# Patient Record
Sex: Male | Born: 1955 | Race: White | Hispanic: No | Marital: Married | Smoking: Current every day smoker
Health system: Southern US, Community
[De-identification: ages and names within clinical notes are randomized; demographics above are authoritative.]

## PROBLEM LIST (undated history)

## (undated) DIAGNOSIS — I10 Essential (primary) hypertension: Secondary | ICD-10-CM

## (undated) DIAGNOSIS — E119 Type 2 diabetes mellitus without complications: Secondary | ICD-10-CM

## (undated) DIAGNOSIS — E785 Hyperlipidemia, unspecified: Secondary | ICD-10-CM

## (undated) HISTORY — DX: Type 2 diabetes mellitus without complications: E11.9

## (undated) HISTORY — PX: OTHER SURGICAL HISTORY: SHX169

## (undated) HISTORY — DX: Hyperlipidemia, unspecified: E78.5

## (undated) HISTORY — DX: Essential (primary) hypertension: I10

---

## 2008-12-22 ENCOUNTER — Ambulatory Visit: Payer: Self-pay | Admitting: Interventional Radiology

## 2008-12-22 ENCOUNTER — Encounter: Payer: Self-pay | Admitting: Emergency Medicine

## 2008-12-23 ENCOUNTER — Encounter: Payer: Self-pay | Admitting: Gastroenterology

## 2008-12-23 ENCOUNTER — Inpatient Hospital Stay (HOSPITAL_COMMUNITY): Admission: EM | Admit: 2008-12-23 | Discharge: 2008-12-26 | Payer: Self-pay | Admitting: Internal Medicine

## 2008-12-23 ENCOUNTER — Ambulatory Visit: Payer: Self-pay | Admitting: Internal Medicine

## 2008-12-23 DIAGNOSIS — R933 Abnormal findings on diagnostic imaging of other parts of digestive tract: Secondary | ICD-10-CM

## 2008-12-28 ENCOUNTER — Encounter: Payer: Self-pay | Admitting: Gastroenterology

## 2008-12-28 ENCOUNTER — Ambulatory Visit (HOSPITAL_COMMUNITY): Admission: RE | Admit: 2008-12-28 | Discharge: 2008-12-28 | Payer: Self-pay | Admitting: Gastroenterology

## 2009-01-03 ENCOUNTER — Encounter (INDEPENDENT_AMBULATORY_CARE_PROVIDER_SITE_OTHER): Payer: Self-pay | Admitting: *Deleted

## 2009-01-03 ENCOUNTER — Telehealth (INDEPENDENT_AMBULATORY_CARE_PROVIDER_SITE_OTHER): Payer: Self-pay | Admitting: *Deleted

## 2009-01-06 ENCOUNTER — Telehealth: Payer: Self-pay | Admitting: Gastroenterology

## 2009-01-06 ENCOUNTER — Emergency Department (HOSPITAL_BASED_OUTPATIENT_CLINIC_OR_DEPARTMENT_OTHER): Admission: EM | Admit: 2009-01-06 | Discharge: 2009-01-06 | Payer: Self-pay | Admitting: Emergency Medicine

## 2009-01-10 ENCOUNTER — Ambulatory Visit: Payer: Self-pay | Admitting: Internal Medicine

## 2009-01-10 ENCOUNTER — Ambulatory Visit (HOSPITAL_COMMUNITY): Admission: RE | Admit: 2009-01-10 | Discharge: 2009-01-10 | Payer: Self-pay | Admitting: Gastroenterology

## 2009-01-10 DIAGNOSIS — R634 Abnormal weight loss: Secondary | ICD-10-CM

## 2009-01-10 DIAGNOSIS — K859 Acute pancreatitis without necrosis or infection, unspecified: Secondary | ICD-10-CM | POA: Insufficient documentation

## 2009-01-10 DIAGNOSIS — R112 Nausea with vomiting, unspecified: Secondary | ICD-10-CM | POA: Insufficient documentation

## 2009-01-10 DIAGNOSIS — R1013 Epigastric pain: Secondary | ICD-10-CM

## 2009-01-10 DIAGNOSIS — I10 Essential (primary) hypertension: Secondary | ICD-10-CM | POA: Insufficient documentation

## 2009-01-11 ENCOUNTER — Inpatient Hospital Stay (HOSPITAL_COMMUNITY): Admission: AD | Admit: 2009-01-11 | Discharge: 2009-01-16 | Payer: Self-pay | Admitting: Gastroenterology

## 2009-01-11 ENCOUNTER — Telehealth (INDEPENDENT_AMBULATORY_CARE_PROVIDER_SITE_OTHER): Payer: Self-pay | Admitting: *Deleted

## 2009-01-12 ENCOUNTER — Telehealth: Payer: Self-pay | Admitting: Gastroenterology

## 2009-01-17 ENCOUNTER — Encounter (INDEPENDENT_AMBULATORY_CARE_PROVIDER_SITE_OTHER): Payer: Self-pay | Admitting: *Deleted

## 2009-01-17 ENCOUNTER — Telehealth (INDEPENDENT_AMBULATORY_CARE_PROVIDER_SITE_OTHER): Payer: Self-pay | Admitting: *Deleted

## 2009-02-13 ENCOUNTER — Ambulatory Visit: Payer: Self-pay | Admitting: Gastroenterology

## 2009-03-06 ENCOUNTER — Telehealth (INDEPENDENT_AMBULATORY_CARE_PROVIDER_SITE_OTHER): Payer: Self-pay | Admitting: *Deleted

## 2009-03-06 ENCOUNTER — Encounter: Payer: Self-pay | Admitting: Gastroenterology

## 2009-04-17 ENCOUNTER — Telehealth (INDEPENDENT_AMBULATORY_CARE_PROVIDER_SITE_OTHER): Payer: Self-pay | Admitting: *Deleted

## 2009-04-19 ENCOUNTER — Encounter (INDEPENDENT_AMBULATORY_CARE_PROVIDER_SITE_OTHER): Payer: Self-pay | Admitting: *Deleted

## 2010-08-27 ENCOUNTER — Encounter: Payer: Self-pay | Admitting: Gastroenterology

## 2010-10-23 IMAGING — CT CT PELVIS W/ CM
2 of 10 series · 12 of 46 positions shown, 18 images · IV contrast (agent unspecified)
Comparison: [HOSPITAL] [HOSPITAL] chest x-ray 12/22/2008
and abdominal pelvic CT 12/22/2008.

CT ABDOMEN

CLINICAL DATA: Follow-up cystic pancreatic lesion 12/22/2008 CT.
Upper abdominal pain, nausea for 2-3 weeks.

CT ABDOMEN WITHOUT AND WITH CONTRAST
CT PELVIS WITH CONTRAST
TECHNIQUE: Multidetector CT imaging of the abdomen was performed
initially following the standard protocol before administration of
intravenous contrast.  Multidetector CT imaging of the abdomen and
pelvis was then performed following the standard protocol during
the bolus injection of intravenous contrast.
Contrast: Pre and post intravenous 100 ml [id number].

[Series 7: venous thins pacs · axial · portal-venous · 0.74mm/px · z∈[-432,-72]mm · 9 of 151 slices shown, 15 images]
[im 16/151  soft-tissue]
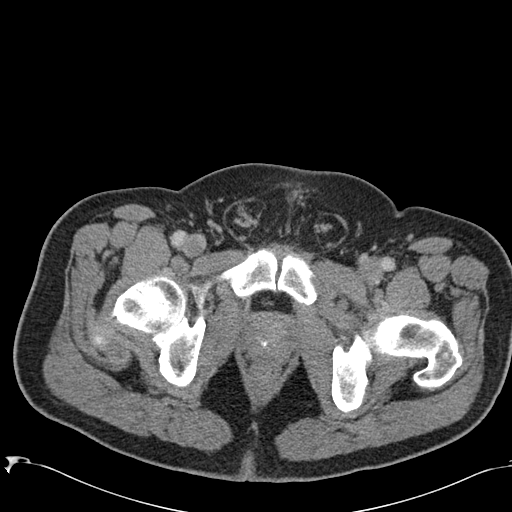
[im 16/151  bone]
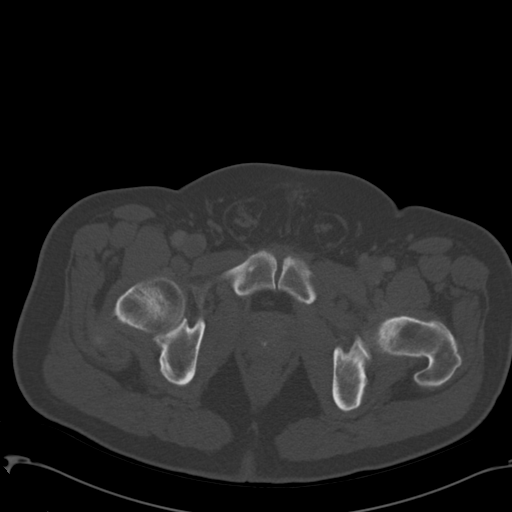
[im 31/151  soft-tissue]
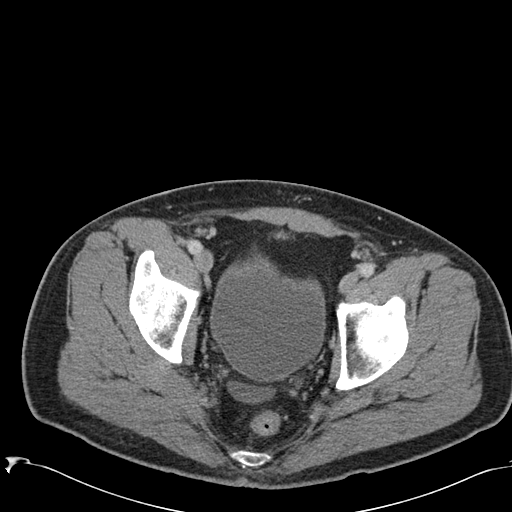
[im 46/151  soft-tissue]
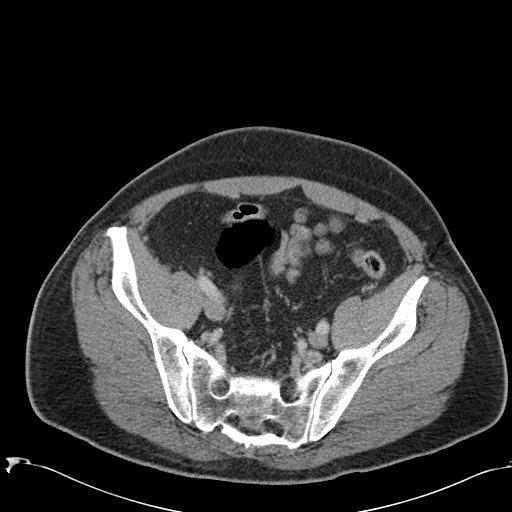
[im 61/151  soft-tissue]
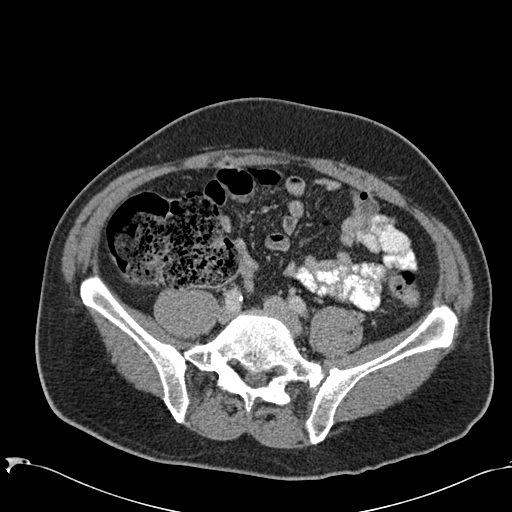
[im 76/151  soft-tissue]
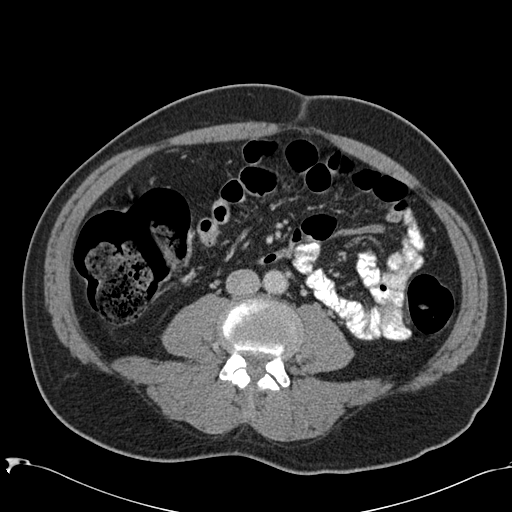
[im 91/151  soft-tissue]
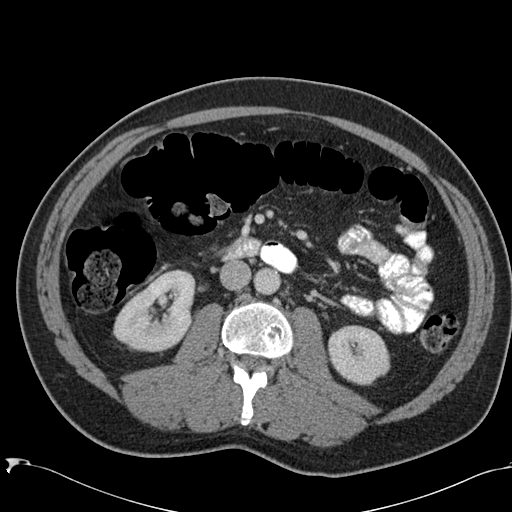
[im 91/151  lung]
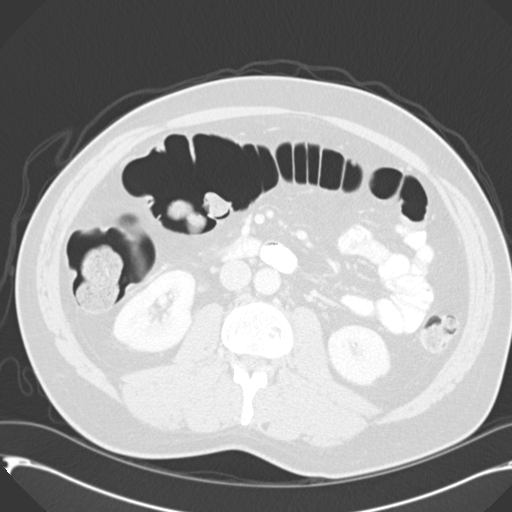
[im 106/151  soft-tissue]
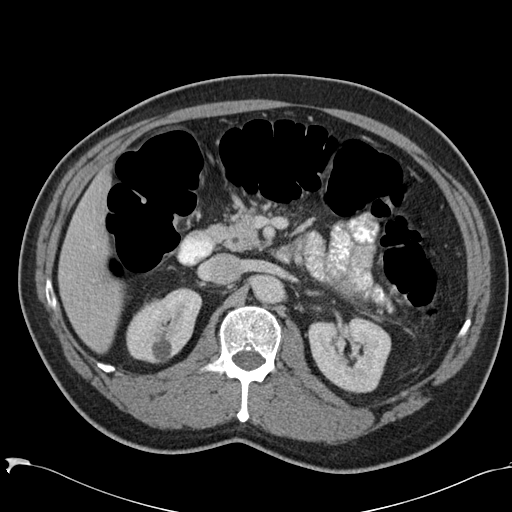
[im 106/151  lung]
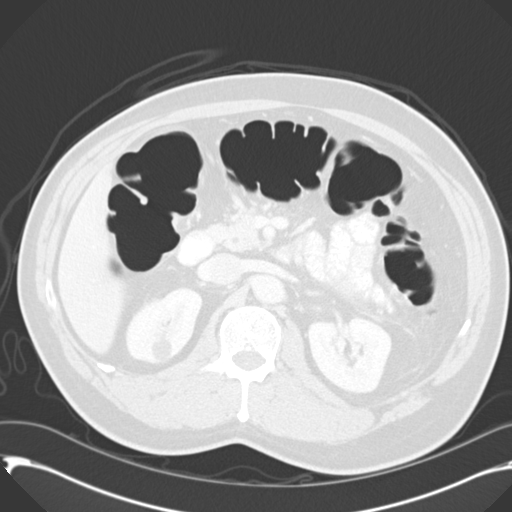
[im 121/151  soft-tissue]
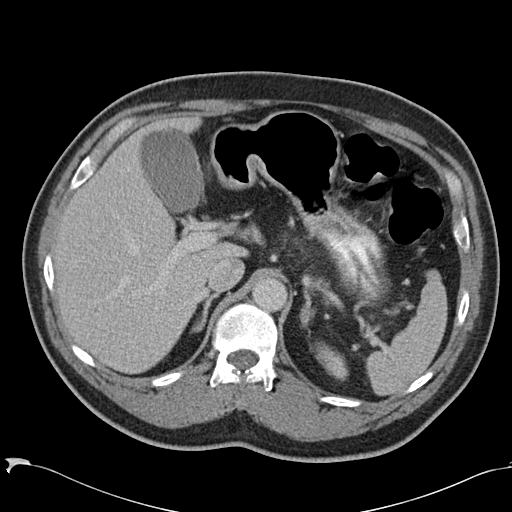
[im 121/151  lung]
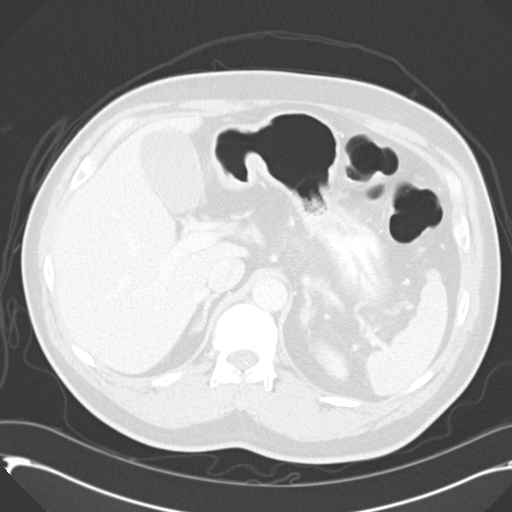
[im 136/151  soft-tissue]
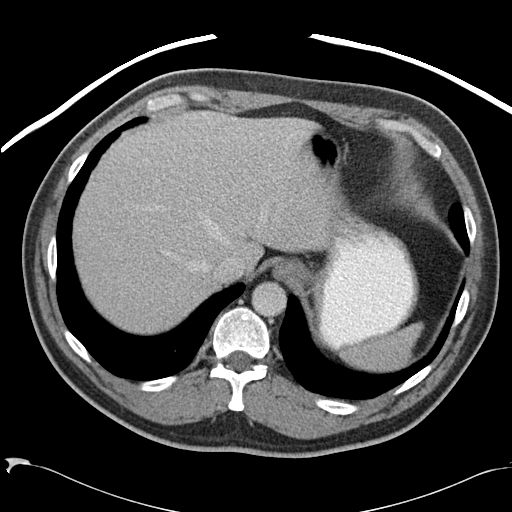
[im 136/151  lung]
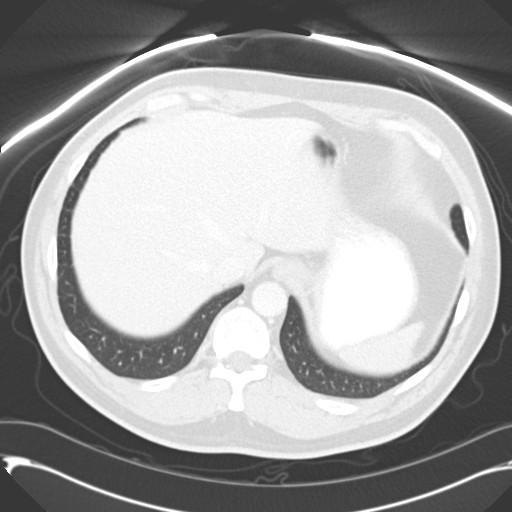
[im 136/151  bone]
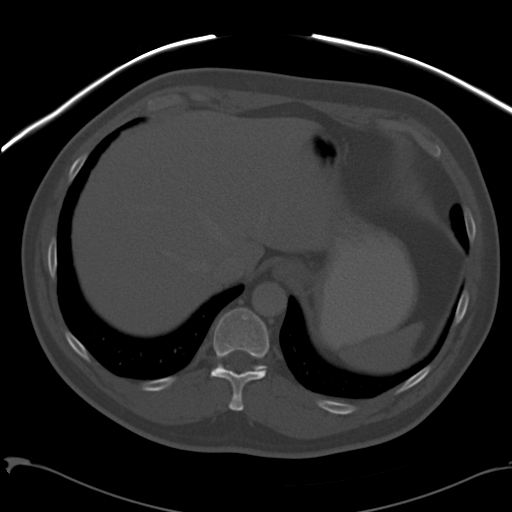

[Series 604: <mpr thick range(2)> · coronal · 0.88mm/px · 3 of 100 slices shown]
[im 20/100  soft-tissue]
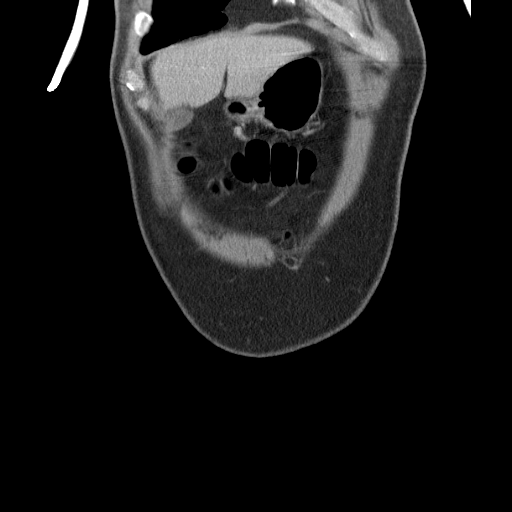
[im 40/100  soft-tissue]
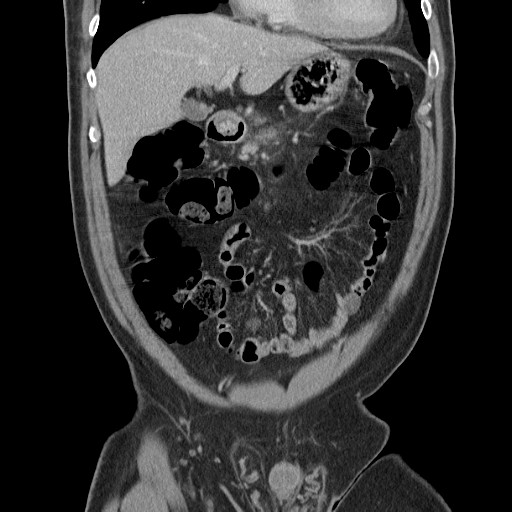
[im 60/100  soft-tissue]
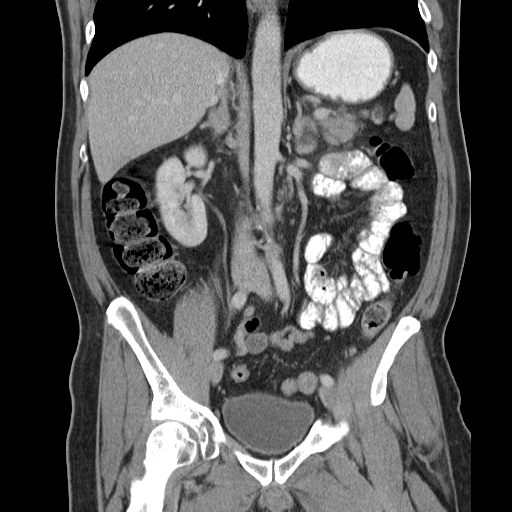

[12 of 46 positions shown; findings below may reference images not displayed]

FINDINGS: Since prior study decreased in peripancreatic
infiltration is seen at the head neck region.  In addition, since
previous 21 mm cystic focus at pancreatic head neck region-
currently measures 7 mm (axial image [DATE]).  Interval development
of 9 mm and adjacent 19 mm AP X 12 mm wide pancreatic body low
density cystic foci (axial image [REDACTED]) and pancreatic tail
15 mm AP X 10 mm wide low density cystic focus(axial image [DATE])
noted with increase pancreatic tail peripancreatic fat stranding.
Currently superior mesenteric and splenic veins and adjacent
arteries appear patent without evidence for avascular invasion.
Lung bases are clear.  No interval ascites visualized.  Possible
dependent slight hyperdense gallsludge or tiny gallstones seen at
gallbladder neck (axial image [DATE]).  No other dilated biliary or
pancreatic ducts visualized.  Currently better seen is 12 mm likely
benign low density right adrenal adenoma, 19 mm posterior upper
pole right renal cyst, and slight atheromatous vascular
calcification with normal caliber abdominal aorta.  No abnormal
upper abdominal calcifications visualized.  Remaining abdominal
organs appear normal without additional inflammation or adenopathy.
IMPRESSION: 1.  Changes at pancreas favoring interval evolution of prior
pancreatitis with current multiple small pancreatic pseudocysts
from head neck region to tail.  Improvement of peri pancreatic
edema at head and neck with slightly greater pancreatic tail
peripancreatic edema visualized.  No dilated biliary or pancreatic
ducts related to findings.
2.  Possible focal hyperdense gallsludge or tiny gallstones at the
gallbladder neck.
3.  Incidental right adrenal adenoma and right renal cyst.
4.  No additional acute findings.

CT PELVIS
FINDINGS: Interval slight dependent pelvic free fluid measures 2
cm long (sagittal image 50) X 1.2 cm AP X 3.6 cm wide (axial image
73/5).  Remaining pelvic organs (to include appendix) appear stable
and unremarkable with no interval inflammation or adenopathy seen.
IMPRESSION: 1.  Slight dependent pelvic free fluid since prior study.
2.  Otherwise stable - negative.

## 2010-11-12 LAB — COMPREHENSIVE METABOLIC PANEL
ALT: 15 U/L (ref 0–53)
AST: 17 U/L (ref 0–37)
Albumin: 3.1 g/dL — ABNORMAL LOW (ref 3.5–5.2)
Albumin: 3.4 g/dL — ABNORMAL LOW (ref 3.5–5.2)
Alkaline Phosphatase: 55 U/L (ref 39–117)
BUN: 10 mg/dL (ref 6–23)
BUN: 18 mg/dL (ref 6–23)
BUN: 23 mg/dL (ref 6–23)
CO2: 19 mEq/L (ref 19–32)
CO2: 24 mEq/L (ref 19–32)
Calcium: 9.4 mg/dL (ref 8.4–10.5)
Calcium: 9.7 mg/dL (ref 8.4–10.5)
Chloride: 100 mEq/L (ref 96–112)
Creatinine, Ser: 1.1 mg/dL (ref 0.4–1.5)
Creatinine, Ser: 1.2 mg/dL (ref 0.4–1.5)
Creatinine, Ser: 1.29 mg/dL (ref 0.4–1.5)
GFR calc Af Amer: >60 mL/min (ref >60)
GFR calc Af Amer: >60 mL/min (ref >60)
GFR calc non Af Amer: 58 mL/min — ABNORMAL LOW (ref >60)
GFR calc non Af Amer: >60 mL/min (ref >60)
Glucose, Bld: 113 mg/dL — ABNORMAL HIGH (ref 70–99)
Glucose, Bld: 116 mg/dL — ABNORMAL HIGH (ref 70–99)
Potassium: 3.9 mEq/L (ref 3.5–5.1)
Sodium: 135 mEq/L (ref 135–145)
Total Bilirubin: 0.4 mg/dL (ref 0.3–1.2)
Total Bilirubin: 0.8 mg/dL (ref 0.3–1.2)
Total Protein: 6.6 g/dL (ref 6.0–8.3)
Total Protein: 7.4 g/dL (ref 6.0–8.3)

## 2010-11-12 LAB — BASIC METABOLIC PANEL
BUN: 21 mg/dL (ref 6–23)
CO2: 24 mEq/L (ref 19–32)
CO2: 25 mEq/L (ref 19–32)
Calcium: 9 mg/dL (ref 8.4–10.5)
Calcium: 9 mg/dL (ref 8.4–10.5)
Chloride: 103 mEq/L (ref 96–112)
Chloride: 99 mEq/L (ref 96–112)
Creatinine, Ser: 1.34 mg/dL (ref 0.4–1.5)
GFR calc Af Amer: >60 mL/min (ref >60)
GFR calc non Af Amer: 56 mL/min — ABNORMAL LOW (ref >60)
Glucose, Bld: 95 mg/dL (ref 70–99)
Glucose, Bld: 96 mg/dL (ref 70–99)
Potassium: 4 mEq/L (ref 3.5–5.1)
Potassium: 4.6 mEq/L (ref 3.5–5.1)
Sodium: 130 mEq/L — ABNORMAL LOW (ref 135–145)
Sodium: 133 mEq/L — ABNORMAL LOW (ref 135–145)

## 2010-11-12 LAB — CBC
HCT: 40.8 % (ref 39.0–52.0)
HCT: 43.3 % (ref 39.0–52.0)
HCT: 49 % (ref 39.0–52.0)
Hemoglobin: 13.7 g/dL (ref 13.0–17.0)
Hemoglobin: 15.8 g/dL (ref 13.0–17.0)
Hemoglobin: 16.4 g/dL (ref 13.0–17.0)
MCHC: 33.5 g/dL (ref 30.0–36.0)
MCHC: 33.5 g/dL (ref 30.0–36.0)
MCHC: 34.4 g/dL (ref 30.0–36.0)
MCV: 89.2 fL (ref 78.0–100.0)
MCV: 89.6 fL (ref 78.0–100.0)
MCV: 90.2 fL (ref 78.0–100.0)
Platelets: 176 10*3/uL (ref 150–400)
Platelets: 221 K/uL (ref 150–400)
RBC: 5.32 MIL/uL (ref 4.22–5.81)
RBC: 5.47 MIL/uL (ref 4.22–5.81)
RDW: 13.2 % (ref 11.5–15.5)
RDW: 13.6 % (ref 11.5–15.5)
RDW: 14.3 % (ref 11.5–15.5)
WBC: 11.1 10*3/uL — ABNORMAL HIGH (ref 4.0–10.5)
WBC: 11.6 K/uL — ABNORMAL HIGH (ref 4.0–10.5)

## 2010-11-12 LAB — DIFFERENTIAL
Basophils Absolute: 0 10*3/uL (ref 0.0–0.1)
Basophils Relative: 0 % (ref 0–1)
Eosinophils Absolute: 0.2 K/uL (ref 0.0–0.7)
Eosinophils Relative: 2 % (ref 0–5)
Lymphocytes Relative: 11 % — ABNORMAL LOW (ref 12–46)
Lymphs Abs: 1.3 10*3/uL (ref 0.7–4.0)
Monocytes Absolute: 0.3 K/uL (ref 0.1–1.0)
Monocytes Relative: 3 % (ref 3–12)
Neutro Abs: 9.8 10*3/uL — ABNORMAL HIGH (ref 1.7–7.7)
Neutrophils Relative %: 84 % — ABNORMAL HIGH (ref 43–77)

## 2010-11-12 LAB — COMPREHENSIVE METABOLIC PANEL WITH GFR
ALT: 11 U/L (ref 0–53)
AST: 17 U/L (ref 0–37)
Albumin: 3.9 g/dL (ref 3.5–5.2)
Alkaline Phosphatase: 70 U/L (ref 39–117)
Chloride: 104 meq/L (ref 96–112)
Potassium: 4.3 meq/L (ref 3.5–5.1)
Sodium: 137 meq/L (ref 135–145)
Total Protein: 7.1 g/dL (ref 6.0–8.3)

## 2010-11-12 LAB — LIPASE, BLOOD
Lipase: 101 U/L — ABNORMAL HIGH (ref 11–59)
Lipase: 1393 U/L — ABNORMAL HIGH (ref 23–300)

## 2010-11-12 LAB — GLUCOSE, CAPILLARY: Glucose-Capillary: 96 mg/dL (ref 70–99)

## 2010-11-12 LAB — AMYLASE: Amylase: 517 U/L — ABNORMAL HIGH (ref 27–131)

## 2010-11-13 LAB — DIFFERENTIAL
Basophils Relative: 1 % (ref 0–1)
Eosinophils Relative: 1 % (ref 0–5)
Lymphocytes Relative: 12 % (ref 12–46)
Monocytes Absolute: 0.8 10*3/uL (ref 0.1–1.0)
Monocytes Relative: 6 % (ref 3–12)
Neutrophils Relative %: 80 % — ABNORMAL HIGH (ref 43–77)

## 2010-11-13 LAB — CBC
HCT: 43.6 % (ref 39.0–52.0)
HCT: 45.6 % (ref 39.0–52.0)
HCT: 50 % (ref 39.0–52.0)
Hemoglobin: 14.4 g/dL (ref 13.0–17.0)
Hemoglobin: 14.8 g/dL (ref 13.0–17.0)
MCHC: 32.8 g/dL (ref 30.0–36.0)
MCHC: 33.8 g/dL (ref 30.0–36.0)
MCV: 89.2 fL (ref 78.0–100.0)
MCV: 89.9 fL (ref 78.0–100.0)
MCV: 90.2 fL (ref 78.0–100.0)
Platelets: 135 10*3/uL — ABNORMAL LOW (ref 150–400)
Platelets: 137 10*3/uL — ABNORMAL LOW (ref 150–400)
Platelets: 147 10*3/uL — ABNORMAL LOW (ref 150–400)
RBC: 4.89 MIL/uL (ref 4.22–5.81)
RDW: 14.1 % (ref 11.5–15.5)
RDW: 14.7 % (ref 11.5–15.5)
RDW: 14.9 % (ref 11.5–15.5)
WBC: 10.3 10*3/uL (ref 4.0–10.5)
WBC: 8.7 10*3/uL (ref 4.0–10.5)

## 2010-11-13 LAB — BASIC METABOLIC PANEL
BUN: 15 mg/dL (ref 6–23)
BUN: 19 mg/dL (ref 6–23)
BUN: 23 mg/dL (ref 6–23)
CO2: 25 mEq/L (ref 19–32)
Calcium: 9 mg/dL (ref 8.4–10.5)
Chloride: 99 mEq/L (ref 96–112)
Chloride: 99 mEq/L (ref 96–112)
Creatinine, Ser: 1.45 mg/dL (ref 0.4–1.5)
GFR calc Af Amer: 56 mL/min — ABNORMAL LOW (ref >60)
GFR calc non Af Amer: 47 mL/min — ABNORMAL LOW (ref >60)
GFR calc non Af Amer: 51 mL/min — ABNORMAL LOW (ref >60)
GFR calc non Af Amer: >60 mL/min (ref >60)
Glucose, Bld: 102 mg/dL — ABNORMAL HIGH (ref 70–99)
Glucose, Bld: 108 mg/dL — ABNORMAL HIGH (ref 70–99)
Glucose, Bld: 119 mg/dL — ABNORMAL HIGH (ref 70–99)
Potassium: 3.7 mEq/L (ref 3.5–5.1)
Sodium: 135 mEq/L (ref 135–145)

## 2010-11-13 LAB — URINALYSIS, ROUTINE W REFLEX MICROSCOPIC
Bilirubin Urine: NEGATIVE
Ketones, ur: 15 mg/dL — AB
Nitrite: NEGATIVE
Protein, ur: 100 mg/dL — AB
Urobilinogen, UA: 0.2 mg/dL (ref 0.0–1.0)

## 2010-11-13 LAB — URINE MICROSCOPIC-ADD ON

## 2010-11-13 LAB — LIPID PANEL
Cholesterol: 168 mg/dL (ref 0–200)
HDL: 27 mg/dL — ABNORMAL LOW (ref >39)
LDL Cholesterol: 125 mg/dL — ABNORMAL HIGH (ref 0–99)
Total CHOL/HDL Ratio: 6.2 RATIO

## 2010-11-13 LAB — TSH: TSH: 2.309 u[IU]/mL (ref 0.350–4.500)

## 2010-11-13 LAB — COMPREHENSIVE METABOLIC PANEL
Albumin: 4.2 g/dL (ref 3.5–5.2)
Alkaline Phosphatase: 75 U/L (ref 39–117)
BUN: 20 mg/dL (ref 6–23)
Calcium: 9.2 mg/dL (ref 8.4–10.5)
Potassium: 3.8 mEq/L (ref 3.5–5.1)
Sodium: 138 mEq/L (ref 135–145)
Total Protein: 7.9 g/dL (ref 6.0–8.3)

## 2010-11-13 LAB — POCT CARDIAC MARKERS
CKMB, poc: 2.3 ng/mL (ref 1.0–8.0)
Myoglobin, poc: 104 ng/mL (ref 12–200)
Troponin i, poc: <0.05 ng/mL (ref 0.00–0.09)

## 2010-11-13 LAB — LIPASE, BLOOD: Lipase: 389 U/L — ABNORMAL HIGH (ref 23–300)

## 2010-11-13 LAB — T4, FREE: Free T4: 1.38 ng/dL (ref 0.80–1.80)

## 2010-11-13 LAB — PROTIME-INR: Prothrombin Time: 14.2 seconds (ref 11.6–15.2)

## 2010-12-18 NOTE — Consult Note (Signed)
NAMEJAKIE, [name] NO.:  [id number]   MEDICAL RECORD NO.:  [id number]          PATIENT TYPE:  INP   LOCATION:  1320                         FACILITY:  [facility]   PHYSICIAN:  Zannie Cove, MD     DATE OF BIRTH:  07/27/1956   DATE OF CONSULTATION:  01/11/2009  DATE OF DISCHARGE:                                 CONSULTATION   This is a consultation for blood pressure management.   PRIMARY CARE PHYSICIAN:  Dr. Wilmer Floor.   GASTROENTEROLOGY PHYSICIAN:  Rachael Fee, MD.   HISTORY OF PRESENTING ILLNESS:  [name] is a 55 year old white male  with known history of hypertension, chronic kidney disease, recently  admitted with pancreatitis and discharged, now readmitted with  persistent symptoms of nausea, vomiting, abdominal pain and a  followup  CT scan which shows small pancreatic pseudocyst.  He was admitted  earlier in the day today and we were consulted since his blood pressure  was found to be elevated at 184/120, which is currently decreased to  168/114.  The patient denies any headache, blurring of vision, any chest  pain, shortness of breath, any weakness in the arms or legs.  He reports  that he did not take his blood pressure meds last night as well as this  morning due to his nausea and vomiting.   PAST MEDICAL HISTORY:  Significant for:  1. Hypertension.  2. Pancreatitis.  3. Chronic kidney disease.   PAST SURGICAL HISTORY:  None.   MEDICATIONS:  1. Include __________  40 mg daily.  2. Metoprolol 50 b.i.d.  3. Norvasc 10.  4. Clonidine 0.1 mg twice daily.  5. Oxycodone 5 mg every 4 hours p.r.n.   SOCIAL HISTORY:  He is married as well as employed.  He drinks about two  to four bourbons per day for the last 20-25 years.  In addition he also  smoked 1/2 - 1 pack per day.   FAMILY HISTORY:  Unknown as he is adopted.   REVIEW OF SYSTEMS:  Positive for about 20 pound weight loss.  Rest of 12  system review negative for HPI.   EXAMINATION:  He is afebrile.  Pulse 92, blood pressure 168/114,  respirations 20.  He is satting 98% on room air.  GENERAL EXAM:  He is alert, wake, oriented x3 in mild discomfort.  HEENT:  Pupils equal, reactive to light. Extraocular movements intact.  No JVD.  CARDIOVASCULAR SYSTEM:  S1, S2.  Regular rate, rhythm.  LUNGS:  Clear to auscultation bilaterally.  ABDOMEN:  Soft, tender in the upper abdomen.  Decreased bowel sounds.  No rebound.  EXTREMITIES: No edema, clubbing, cyanosis.  NEURO EXAM:  Nonfocal.   CBC:  White count of 11.6, hemoglobin 16.4, platelets 221.  Chemistries:  Sodium 137, potassium 4.3, chloride 104, bicarb 24, BUN 18, creatinine  1.1, glucose 113.  Liver panel is normal including albumin of 3.9.  Calcium 9.4 and lipase is elevated at 1393.   ASSESSMENT/PLAN:  [name] with uncontrolled  hypertension since patient did not get any of his oral blood  pressure  medications yesterday as well as today yet.  We recommend restarting his  home medications of metoprolol 50 mg p.o. b.i.d.  If he cannot tolerate  p.o., change to IV Lopressor 5 mg every 6 hours.  In addition restart  Clinidine 0.1 mg b.i.d. and Norvasc at 10 mg daily.  In addition we will  use IV hydralazine 10 mg every 4-6 hours p.r.n. if blood pressure  greater than 160/110.  In addition recommend better control of his  symptoms namely his pain as well as nausea.  Plan for NJ tube noted.  Recommend that once the tube is placed to give his p.o. medications via  the NJ  tube.  However, the pills should be more spaced between each  other due to rapid absorption and risk of sudden drop in blood pressure.  1. Recurrent pancreatitis with pseudocyst.  Continue management per      primary service.  2. History of alcohol abuse.  Patient on CIWA protocol.  3. Thank you for the consult.  We will follow with you.      Zannie Cove, MD  Electronically Signed     PJ/MEDQ  D:  01/11/2009   T:  01/11/2009  Job:  [id number]

## 2010-12-18 NOTE — Discharge Summary (Signed)
NAMEJAYLIN, BENZEL NO.:  [id number]   MEDICAL RECORD NO.:  [id number]          PATIENT TYPE:  INP   LOCATION:  1416                         FACILITY:  [facility]   PHYSICIAN:  Marcellus Scott, MD     DATE OF BIRTH:  March 29, 1956   DATE OF ADMISSION:  12/23/2008  DATE OF DISCHARGE:  12/26/2008                               DISCHARGE SUMMARY   PRIMARY MEDICAL DOCTOR:  Gentry Fitz.   DISCHARGE DIAGNOSES:  1. Abdominal pain secondary to pancreatic mass with associated mild      pancreatitis.  2. Accelerated hypertension.  3. Stage 2 chronic kidney disease.  4. Thrombocytopenia.  5. Tobacco abuse.  6. Alcohol abuse.   DISCHARGE MEDICATIONS:  1. Lisinopril 40 mg p.o. daily.  2. Metoprolol 50 mg p.o. b.i.d.  3. Amlodipine 10 mg p.o. daily.  4. Clonidine 0.1 mg p.o. b.i.d.  5. Oxycodone IR 5 mg tablets, 1-2 p.o. q.4. h. p.r.n. 30 tablets      dispensed.   PROCEDURES.:  1. CT of the abdomen with contrast, impression:  Focal lesion of head      of pancreas with some surrounding inflammation in the      peripancreatic fat.  This is highly suspicious for primary      pancreatic carcinoma.  There is no evidence of metastatic disease      in the abdomen.  The stranding in the surrounding fat may be      representative of tumor infiltration or pancreatitis.  An otherwise      worrisome finding is loss of tissue plain with adjacent __________      mesenteric vein and visible narrowing of the vein.  This likely      reflects vascular invasion which may make this lesion unresectable.      Correlation suggested with  pancreatic enzymes as well as tumor      markers.  2. CT of the pelvis.  No enlarged lymph nodes or free fluid.  3. Chest x-ray:  Cardiomegaly and right basilar atelectasis.   PERTINENT LABS:  Basic metabolic panel with sodium 132, creatinine 1.45.  CBC with hemoglobin 14.6, hematocrit 43.8, white blood cell 8.7,  platelets 137,000.  INR is 1.1.  Homocystine  12.7.  CEA 19.9 is 14.8.  Hemoglobin A1c is 5.9.  Thyroid function tests are within normal limits.  Lipid panel with HDL 27, LDL 125.  Urine microscopy not suggestive of  urinary tract infection.  Point of care cardiac markers were negative.  Lipase on admission was 389.  Hepatic panel was unremarkable.   CONSULTATIONS:   GI, Dr. Lina Sar.   HOSPITAL COURSE AND PATIENT DISPOSITION:  Mr. Sessler is a 55 year old  Caucasian gentleman with no medical followup.  He presented with  intermittent abdominal pain and nausea.  He was found to have markedly  elevated blood pressure in the Emergency Room  of 197/144 mmHg.  His CT  of the abdomen revealed a pancreatic mass.  He was thereby admitted to  the hospital for further evaluation and management.   1. Abdominal pain secondary to  pancreatic mass.  Rule out pancreatic      cancer.  Associated mild pancreatitis.  GI was consulted.  They      have arranged an endoscopic ultrasound and biopsy of pancreas by      Dr. Wendall Papa on Wednesday 5/26 at 12 noon in the Endoscopy      Department at [facility].  The patient has received      instructions for same.  His abdominal pain is slowly decreasing but      he still requires significant amount of pain medications.  2. Accelerated hypertension.  This has been difficult to control.      With multiple medications, his blood pressures now range between      130s to 140s over mainly 70s to 80s.  The patient has been advised      regarding compliance with medications.  He verbalizes      understanding.  Consider workup for secondary causes as an      outpatient.  3. Stage 2 chronic kidney disease.  The patient had a creatinine of      1.4 on admission.  There is no prior creatinine to compare this      with.  This makes a stage 2 chronic kidney disease which has      remained fairly stable in that range.  His CT of the abdomen did      not show any hydronephrosis.  For outpatient  followup and consider      Nephrology consult.  4. Thrombocytopenia.  Question related to alcohol abuse.  Again,      recommend outpatient periodic followup of his CBCs.  Consider      hematology consult.  5. Tobacco abuse.  Cessation counseling done.  The patient declines a      nicotine patch.  6. Alcohol abuse.  The patient has not demonstrated any features of      alcohol withdrawal.  He has been recommended for moderation or even      cessation of alcohol.  He verbalizes understanding.   The patient at this time is stable for discharge to follow up with the  gastroenterologist.  The patient does not have a primary medical doctor.  We will provide Dr. Della Goo phone number who is still accepting  new patients.  The patient is agreeable to this.   Time taken in coordinating this discharge is 25 minutes.      Marcellus Scott, MD  Electronically Signed     AH/MEDQ  D:  12/26/2008  T:  12/26/2008  Job:  [id number]   cc:   Della Goo, M.D.  Fax: [phone]   Rachael Fee, MD  [address]  [place], [place] [address]   Hedwig Morton. Juanda Chance, MD  [address]  [place]  [place] [address]

## 2010-12-18 NOTE — H&P (Signed)
[name], [name] NO.:  [id number]   MEDICAL RECORD NO.:  [id number]          PATIENT TYPE:  INP   LOCATION:  1416                         FACILITY:  [facility]   PHYSICIAN:  Vania Rea, M.D. DATE OF BIRTH:  20-May-1956   DATE OF ADMISSION:  12/23/2008  DATE OF DISCHARGE:                              HISTORY & PHYSICAL   PRIMARY CARE PHYSICIAN:  Unassigned.   EMERGENCY ROOM PHYSICIAN:  Dr. Annye Rusk.   CHIEF COMPLAINT:  Abdominal pain.   HISTORY OF PRESENT ILLNESS:  This is a 55 year old Caucasian gentleman  with no medical follow-up, who last had his blood pressure checked about  4 years ago and for the past week has been having intermittent abdominal  pains associated with nausea.  He has not had any vomiting or diarrhea.  He has been taking laxatives for intermittent relief of the bloating  that is associated with this abdominal pain.  He has had no weight loss  nor weight gain.  He has had no jaundice or dyspepsia.  At the [facility] emergency room the patient was noted to have a  markedly elevated blood pressure and received one dose of intravenous  labetalol for this.  He had a CAT scan of his abdomen for his abdominal  pain and was found to have a 1.5 to 2 cm mass in the head of his  pancreas suspicious for cancer.  The hospitalist service was contacted  and the patient was admitted in transfer for management of his high  blood pressure.   The patient is currently complaining of a headache, abdominal pain and  nausea.  There is otherwise no problems.   PAST MEDICAL HISTORY:  None.   MEDICATIONS:  None.   ALLERGIES:  NO KNOWN DRUG ALLERGIES.   SOCIAL HISTORY:  He smokes 10-15 cigarettes per day.  Says he drinks 2-3  glasses of her bourbon about every 4 days.  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  He is married and lives with his wife who is a Engineer, civil (consulting)  on disability.   FAMILY HISTORY:  The patient is adopted and has  no idea of his family  medical history.   REVIEW OF SYSTEMS:  Other than noted above, a 10-point review of systems  is unremarkable.   PHYSICAL EXAMINATION:  GENERAL:  An obese middle-aged Caucasian  gentleman lying in bed, appears to be quite comfortable.  VITAL SIGNS:  Temperature is 97.6, pulse 83, respirations 20, blood pressure 183/125.  He is saturating 100% on room air.  HEENT:  His pupils are round and equal.  Mucous membranes pink and  anicteric.  He is not dehydrated.  NECK:  No cervical lymphadenopathy or thyromegaly.  No jugulovenous  distention.  No carotid bruits.  CHEST:  Clear to auscultation  bilaterally.  CARDIOVASCULAR:  Regular rhythm without murmur.  ABDOMEN:  He does have epigastric tenderness but there is no mass.  He  has normal abdominal bowel sounds.  EXTREMITIES:  Without edema.  He has 3+ bounding pulses bilaterally.  CENTRAL NERVOUS SYSTEM:  Cranial nerves II-XII are grossly intact.  No  focal neurologic deficit.   LABORATORY DATA:  He has a mild leukocytosis of 13.7.  His hemoglobin is  16, platelets low at 147.  He has 80% neutrophils and his absolute  neutrophil count is 11.1.  He has atypical lymphocytes and large  platelets on the smear.  Complete metabolic panel significant for an  elevated glucose of 165, BUN of 20 and a creatinine of 1.4.  His liver  functions are unremarkable.  His calcium is 9.2, albumin is 4.2.  Serum  lipase is slightly elevated at 389 with the upper limits of normal being  300.  His cardiac enzymes are completely normal with undetectable  troponins.  His urinalysis is significant for specific gravity of 1.040,  trace of ketones and 100 protein.  Microscopy reveals no abnormal  constituents.  Two-view chest x-ray shows cardiomegaly with right  basilar atelectasis.  CT scan of the abdomen and pelvis revealed a focal  lesion of the head of the pancreas with peripancreatic inflammation  suspicious for a primary pancreatic  carcinoma.  There is no evidence of  metastatic disease in the abdomen, but stranding surrounding the  pancreatic fat could represent tumor inflammation of pancreatitis.  Loss  of tissue planes suggests possible vascular invasion which may make the  tumor unresectable.  There is no evidence of fluid or pelvic lymph  nodes.   ASSESSMENT:  1. Hypertensive urgency.  2. Newly discovered pancreatic tumor, rule out malignancy.  3. Alcohol abuse.  4. Tobacco abuse.   PLAN:  Since this patient is having no serious symptomatology from his  hypertension, we will start him on oral antihypertensive medications to  gradually lower his blood pressure.  We will order an MRI of his abdomen  to further characterize his abdominal mass and we will also order  pancreatic tumor markers.  We will consider a surgical consult in the  morning.  Other plans as per orders.      Vania Rea, M.D.  Electronically Signed     LC/MEDQ  D:  12/23/2008  T:  12/23/2008  Job:  [id number]   cc:   Annye Rusk, MD

## 2010-12-18 NOTE — Discharge Summary (Signed)
[name], MANNELLA NO.:  [id number]   MEDICAL RECORD NO.:  [id number]          PATIENT TYPE:  INP   LOCATION:  1320                         FACILITY:  [facility]   PHYSICIAN:  Rachael Fee, MD   DATE OF BIRTH:  12-21-1955   DATE OF ADMISSION:  01/11/2009  DATE OF DISCHARGE:  01/16/2009                               DISCHARGE SUMMARY   DISPOSITION:  Home in stable condition.   DISCHARGE MEDICATIONS:  The patient would continue his home medications  including:  1. Lisinopril 40 mg daily.  2. Metoprolol 50 mg b.i.d.  3. Amlodipine 10 mg daily.  4. Clonidine 0.5 mg twice a day.  5. OxyIR 5 mg 1-2 tablets every 4 hours as needed.   CONSULTATIONS:  Hospitalist was consulted for management of  hypertension.   PROCEDURES:  None performed this admission.   ADMITTING DIAGNOSES:  1. Acute pancreatitis, nausea, abdominal pain, and weight loss,      refractory to outpatient management.  2. Hypertension, uncontrolled.  3. Chronic kidney disease.  4. History of thrombocytopenia, possibly secondary to history of      alcohol abuse.  5. Tobacco abuse.   HOSPITAL COURSE:  [name] is a 55 year old male who was admitted on  January 11, 2009, to the services of Dr. Melvia Heaps, who was on-call for  the hospital.  Mr. Taussig was followed by Dr. Christella Hartigan in our office for  history of pancreatitis, complicated by pseudocyst.  The patient was not  thriving at home.  He was having increased abdominal pain and nausea,  worse with meals.  He was admitted for further evaluation and treatment.  On admission, the patient was started on IV fluids, subcutaneous heparin  for DVT prophylaxis, and was made n.p.o.  He was given antiemetics and  analgesics as needed for pain.  The patient's home blood pressure  medications were continued.  On the day of admission, his white count  was 11.1, hemoglobin 15.8, hematocrit 46.8, and platelets 199.  Electrolytes were normal.  Glucose is elevated  at 116.  BUN and  creatinine were normal.  His amylase was elevated at 517 and lipase was  101.  The patient was afebrile.  He was hypertensive on admission with a  blood pressure of 160/114.  Hospitalist was consulted for hypertension.  The patient had reported excessive weight loss secondary to pain and  nausea.  A small bowel feeding tube was ordered but placed on hold as  the patient began to show improvement and began to tolerate clear  liquids without worsening symptoms.  The patient was placed on an  alcohol withdrawal protocol, he had no withdrawal symptoms throughout  this admission.  By the second day of admission, the patient was  tolerating a low-fat full liquid diet.  He remained afebrile, white  count 9.2.  On the day of discharge and the fifth day of admission, the  patient was feeling better.  He was tolerating a low-fat solid diet,  ambulating.  The patient was afebrile, blood pressure was nicely  controlled at 109/76.  The  patient was discharged home with instructions  to follow up in our office in 4-5 weeks.  His outpatient CAT scan, which  had been scheduled as a result of office visit with Dr. Christella Hartigan was  cancelled.   DISCHARGE DIAGNOSES:  1. Resolving acute pancreatitis, likely alcohol related.  2. Gallstones versus sludge.  3. Hypertension, controlled at the time of discharge.  4. Chronic kidney disease.  5. Alcohol abuse.  6. Tobacco abuse.      Willette Cluster, NP      Rachael Fee, MD  Electronically Signed    PG/MEDQ  D:  02/02/2009  T:  02/03/2009  Job:  [id number]

## 2020-05-18 ENCOUNTER — Other Ambulatory Visit: Payer: Self-pay

## 2020-05-18 ENCOUNTER — Telehealth: Payer: Self-pay | Admitting: Medical

## 2020-05-18 ENCOUNTER — Ambulatory Visit (INDEPENDENT_AMBULATORY_CARE_PROVIDER_SITE_OTHER): Payer: 59 | Admitting: Medical

## 2020-05-18 ENCOUNTER — Ambulatory Visit (HOSPITAL_BASED_OUTPATIENT_CLINIC_OR_DEPARTMENT_OTHER)
Admission: RE | Admit: 2020-05-18 | Discharge: 2020-05-18 | Disposition: A | Discharge status: Home / Self Care | Payer: 59 | Source: Ambulatory Visit | Attending: Medical | Admitting: Medical

## 2020-05-18 VITALS — BP 115/70 | HR 90 | Ht 72.0 in | Wt 225.0 lb

## 2020-05-18 DIAGNOSIS — M5441 Lumbago with sciatica, right side: Secondary | ICD-10-CM | POA: Insufficient documentation

## 2020-05-18 DIAGNOSIS — Z23 Encounter for immunization: Secondary | ICD-10-CM | POA: Diagnosis not present

## 2020-05-18 DIAGNOSIS — Z122 Encounter for screening for malignant neoplasm of respiratory organs: Secondary | ICD-10-CM

## 2020-05-18 DIAGNOSIS — E1022 Type 1 diabetes mellitus with diabetic chronic kidney disease: Secondary | ICD-10-CM | POA: Diagnosis not present

## 2020-05-18 DIAGNOSIS — I429 Cardiomyopathy, unspecified: Secondary | ICD-10-CM

## 2020-05-18 DIAGNOSIS — G8929 Other chronic pain: Secondary | ICD-10-CM | POA: Diagnosis present

## 2020-05-18 DIAGNOSIS — F17200 Nicotine dependence, unspecified, uncomplicated: Secondary | ICD-10-CM

## 2020-05-18 DIAGNOSIS — Z1211 Encounter for screening for malignant neoplasm of colon: Secondary | ICD-10-CM

## 2020-05-18 DIAGNOSIS — E785 Hyperlipidemia, unspecified: Secondary | ICD-10-CM | POA: Diagnosis not present

## 2020-05-18 DIAGNOSIS — G629 Polyneuropathy, unspecified: Secondary | ICD-10-CM

## 2020-05-18 DIAGNOSIS — I10 Essential (primary) hypertension: Secondary | ICD-10-CM | POA: Diagnosis not present

## 2020-05-18 DIAGNOSIS — R350 Frequency of micturition: Secondary | ICD-10-CM

## 2020-05-18 MED ORDER — GABAPENTIN 300 MG PO CAPS: 300.0000 mg | ORAL_CAPSULE | Freq: Three times a day (TID) | ORAL | 3 refills | Status: DC

## 2020-05-18 NOTE — Telephone Encounter (Signed)
Rx gabapentin sent to pt pharmacy. 

## 2020-05-18 NOTE — Telephone Encounter (Signed)
Opened to review 

## 2020-05-18 NOTE — Patient Instructions (Addendum)
For htn continue current bp med regimen losartn, metoprolol and nifedipine.  For diabetes continue insulin rx'd by endocrinologist. Last a1c 6.5.  Ct chest screening placed.   Referred to Gi MD for colonoscopy.  For frequent urination get poct UA. Also get psa. When lab back may rx flomax.  For high cholesterol continue zocor.  For back pain get lumbar spine xray.  Follow up 4-6 weeks early am. Want to do wellness and get fasting labs.

## 2020-05-18 NOTE — Progress Notes (Signed)
Subjective:    Patient ID: [name], male    DOB: [birth year], 64 y.o.   MRN: [id number]  HPI  Pt in for first time.  Pt does see endocrinologist. Sees Dr. Warden Fillers in [place]. He was handling primary care concerns as well.  Pt does walk dogs 3-4 days a week. Pt has 2 dogs.  Pt retired from [employer].  Pt does eat healthy foods. States has to be with diabetes. Pancreatitis twice in 2010. Last a1 6.5 in January.  Pt does smoke some. Smoked since teenager per wife. At least 30 yr pack a day history.   Hx of hyperlipidemia. On zocor. Last a1c in January controlled except hdl was low.  Htn hx. On losartan, metprolol and nifedipine. bp controlled. bp 155 systolic this morning. Bp reading at home over 140/90 about half the time other half below and borderline.  Pt has diabetic neuropathy. On gabapentin.  Pt has had covid vaccine  Pt never had colonoscopy.  Frequent urination at night for several years. Since 2015 at least.    Review of Systems  Constitutional: Negative for chills, fatigue and fever.  Respiratory: Negative for cough, chest tightness, shortness of breath and wheezing.   Cardiovascular: Negative for chest pain and palpitations.  Gastrointestinal: Negative for abdominal pain.  Musculoskeletal: Negative for back pain.  Skin: Negative for rash.  Neurological: Negative for dizziness, light-headedness and headaches.  Hematological: Negative for adenopathy. Does not bruise/bleed easily.  Psychiatric/Behavioral: Negative for behavioral problems, confusion and hallucinations. The patient is not nervous/anxious.     No past medical history on file.   Social History   Socioeconomic History  . Marital status: Married    Spouse name: Not on file  . Number of children: Not on file  . Years of education: Not on file  . Highest education level: Not on file  Occupational History  . Not on file  Tobacco Use  . Smoking status: Not on file  Substance and Sexual  Activity  . Alcohol use: Not on file  . Drug use: Not on file  . Sexual activity: Not on file  Other Topics Concern  . Not on file  Social History Narrative  . Not on file   Social Determinants of Health   Financial Resource Strain:   . Difficulty of Paying Living Expenses: Not on file  Food Insecurity:   . Worried About Programme researcher, broadcasting/film/video in the Last Year: Not on file  . Ran Out of Food in the Last Year: Not on file  Transportation Needs:   . Lack of Transportation (Medical): Not on file  . Lack of Transportation (Non-Medical): Not on file  Physical Activity:   . Days of Exercise per Week: Not on file  . Minutes of Exercise per Session: Not on file  Stress:   . Feeling of Stress : Not on file  Social Connections:   . Frequency of Communication with Friends and Family: Not on file  . Frequency of Social Gatherings with Friends and Family: Not on file  . Attends Religious Services: Not on file  . Active Member of Clubs or Organizations: Not on file  . Attends Banker Meetings: Not on file  . Marital Status: Not on file  Intimate Partner Violence:   . Fear of Current or Ex-Partner: Not on file  . Emotionally Abused: Not on file  . Physically Abused: Not on file  . Sexually Abused: Not on file    No family  history on file.  Not on File  Current Outpatient Medications on File Prior to Visit  Medication Sig Dispense Refill  . insulin NPH Human (NOVOLIN N) 100 UNIT/ML injection Inject 30 units every morning before breakfast, and 20 units every evening before dinner.    . Insulin Syringe-Needle U-100 (INSULIN SYRINGE .5CC/30GX5/16") 30G X 5/16" 0.5 ML MISC For use with Reli-On brand NPH insulin.  One injection twice daily.    Marland Kitchen losartan (COZAAR) 50 MG tablet TAKE 1 TABLET(50 MG) BY MOUTH DAILY    . metoprolol succinate (TOPROL-XL) 100 MG 24 hr tablet TAKE 1 TABLET(100 MG) BY MOUTH DAILY    . simvastatin (ZOCOR) 40 MG tablet TAKE 1 TABLET(40 MG) BY MOUTH AT  BEDTIME    . Aspirin Buf,CaCarb-MgCarb-MgO, 81 MG TABS Take by mouth.    . gabapentin (NEURONTIN) 300 MG capsule Take by mouth.    Marland Kitchen NIFEdipine (ADALAT CC) 90 MG 24 hr tablet Take 90 mg by mouth daily.    . Omega-3 Fatty Acids (FISH OIL) 1000 MG CAPS Take 4 capsules by mouth daily.    . TRUE METRIX BLOOD GLUCOSE TEST test strip SMARTSIG:Via Meter 1 to 3 Times Daily     No current facility-administered medications on file prior to visit.    BP (!) 109/91   Pulse 90   Ht 6' (1.829 m)   Wt 225 lb (102.1 kg)   SpO2 99%   BMI 30.52 kg/m       Objective:   Physical Exam    General Mental Status- Alert. General Appearance- Not in acute distress.   Skin General: Color- Normal Color. Moisture- Normal Moisture.  Neck Carotid Arteries- Normal color. Moisture- Normal Moisture. No carotid bruits. No JVD.  Chest and Lung Exam Auscultation: Breath Sounds:-Normal.  Cardiovascular Auscultation:Rythm- Regular. Murmurs & Other Heart Sounds:Auscultation of the heart reveals- No Murmurs.  Abdomen Inspection:-Inspeection Normal. Palpation/Percussion:Note:No mass. Palpation and Percussion of the abdomen reveal- Non Tender, Non Distended + BS, no rebound or guarding.   Neurologic Cranial Nerve exam:- CN III-XII intact(No nystagmus), symmetric smile. Strength:- 5/5 equal and symmetric strength both upper and lower extremities.  Rectal- portion of prostate felt smooth and mild enlarged. No nodules.     Assessment & Plan:  For htn continue current bp med regimen losartn, metoprolol and nifedipine.  For diabetes continue insulin rx'd by endocrinologist. Last a1c 6.5.  Ct chest screening placed.   Referred to Gi MD for colonoscopy.  For frequent urination get poct UA. Also get psa. When lab back may rx flomax.  For high cholesterol continue zocor.  For back pain get lumbar spine xray.  Follow up 4-6 weeks early am. Want to do wellness and get fasting labs.  Time spent with  new  patient today was 50  minutes which consisted of chart review/epic care everywhgeere, discussing diagnoses, work up treatment and documentation.

## 2020-05-19 ENCOUNTER — Telehealth: Payer: Self-pay | Admitting: Medical

## 2020-05-19 LAB — PSA: PSA: 0.71 ng/mL (ref <=4.0)

## 2020-05-19 MED ORDER — TAMSULOSIN HCL 0.4 MG PO CAPS: 0.4000 mg | ORAL_CAPSULE | Freq: Every day | ORAL | 3 refills | Status: DC

## 2020-05-19 NOTE — Telephone Encounter (Signed)
Rx flomax sent to pt pharmacy. 

## 2020-05-22 MED ORDER — TAMSULOSIN HCL 0.4 MG PO CAPS: 0.4000 mg | ORAL_CAPSULE | Freq: Every day | ORAL | 3 refills | Status: DC

## 2020-05-22 NOTE — Addendum Note (Signed)
Addended by: Thelma Barge D on: 05/22/2020 11:08 AM   Modules accepted: Orders

## 2020-05-26 ENCOUNTER — Telehealth: Payer: Self-pay | Admitting: Medical

## 2020-05-26 MED ORDER — GABAPENTIN 300 MG PO CAPS: 300.0000 mg | ORAL_CAPSULE | Freq: Three times a day (TID) | ORAL | 3 refills | Status: DC

## 2020-05-26 NOTE — Telephone Encounter (Signed)
Medication sent to the wrong pharmacy , please resend medication below to [facility]  gabapentin (NEURONTIN) 300 MG capsule [[id number]]     Correct Pharmacy :  [id number] - [place], [place] - [address] AT [facility] OF [address] Phone:  [phone]  Fax:  [phone]

## 2020-05-27 MED ORDER — GABAPENTIN 300 MG PO CAPS: 300.0000 mg | ORAL_CAPSULE | Freq: Three times a day (TID) | ORAL | 3 refills | Status: DC

## 2020-05-27 NOTE — Addendum Note (Signed)
Addended by: Gwenevere Abbot on: 05/27/2020 09:41 AM   Modules accepted: Orders

## 2020-05-27 NOTE — Telephone Encounter (Signed)
Rx gabapentin sent to [facility]. Correct pharmacy.

## 2020-06-01 ENCOUNTER — Ambulatory Visit (HOSPITAL_BASED_OUTPATIENT_CLINIC_OR_DEPARTMENT_OTHER): Payer: 59

## 2020-06-23 ENCOUNTER — Other Ambulatory Visit: Payer: Self-pay

## 2020-06-23 ENCOUNTER — Ambulatory Visit (INDEPENDENT_AMBULATORY_CARE_PROVIDER_SITE_OTHER): Payer: 59 | Admitting: Medical

## 2020-06-23 VITALS — BP 115/79 | HR 76 | Resp 18 | Ht 72.0 in | Wt 227.0 lb

## 2020-06-23 DIAGNOSIS — N401 Enlarged prostate with lower urinary tract symptoms: Secondary | ICD-10-CM | POA: Diagnosis not present

## 2020-06-23 DIAGNOSIS — I10 Essential (primary) hypertension: Secondary | ICD-10-CM

## 2020-06-23 DIAGNOSIS — E785 Hyperlipidemia, unspecified: Secondary | ICD-10-CM | POA: Diagnosis not present

## 2020-06-23 DIAGNOSIS — E1022 Type 1 diabetes mellitus with diabetic chronic kidney disease: Secondary | ICD-10-CM | POA: Diagnosis not present

## 2020-06-23 LAB — COMPREHENSIVE METABOLIC PANEL
ALT: 26 U/L (ref 0–53)
AST: 22 U/L (ref 0–37)
Albumin: 3.9 g/dL (ref 3.5–5.2)
Alkaline Phosphatase: 67 U/L (ref 39–117)
BUN: 30 mg/dL — ABNORMAL HIGH (ref 6–23)
CO2: 26 mEq/L (ref 19–32)
Calcium: 9.1 mg/dL (ref 8.4–10.5)
Chloride: 103 mEq/L (ref 96–112)
Creatinine, Ser: 1.81 mg/dL — ABNORMAL HIGH (ref 0.40–1.50)
GFR: 39.1 mL/min — ABNORMAL LOW (ref >60.00)
Glucose, Bld: 216 mg/dL — ABNORMAL HIGH (ref 70–99)
Potassium: 5 mEq/L (ref 3.5–5.1)
Sodium: 138 mEq/L (ref 135–145)
Total Bilirubin: 0.4 mg/dL (ref 0.2–1.2)
Total Protein: 6.5 g/dL (ref 6.0–8.3)

## 2020-06-23 LAB — LIPID PANEL
Cholesterol: 113 mg/dL (ref 0–200)
HDL: 31.1 mg/dL — ABNORMAL LOW (ref >39.00)
LDL Cholesterol: 59 mg/dL (ref 0–99)
NonHDL: 81.72
Total CHOL/HDL Ratio: 4
Triglycerides: 116 mg/dL (ref 0.0–149.0)
VLDL: 23.2 mg/dL (ref 0.0–40.0)

## 2020-06-23 MED ORDER — TAMSULOSIN HCL 0.4 MG PO CAPS: ORAL_CAPSULE | ORAL | 3 refills | Status: DC

## 2020-06-23 NOTE — Progress Notes (Signed)
Subjective:    Patient ID: [name], male    DOB: 1956-01-07, 64 y.o.   MRN: [id number]  HPI  Pt bp is well controlled today. On metoprolol and nifedipine.  Pt has high cholesterol. On simvastatin and fish oil.  Hx of diabetes. Sees endocrinologist. Last a1c.7.1.   Pt never had colonoscopy. Pt states defers due to 20% total cost.    Review of Systems  Constitutional: Negative for chills, fatigue and fever.  HENT: Negative for congestion and drooling.   Respiratory: Negative for cough, chest tightness, shortness of breath and wheezing.   Cardiovascular: Negative for chest pain and palpitations.  Gastrointestinal: Negative for abdominal pain, blood in stool, constipation, diarrhea, nausea and vomiting.  Endocrine: Negative for polydipsia and polyuria.  Genitourinary: Negative for dysuria.  Musculoskeletal: Negative for back pain and myalgias.  Skin: Negative for rash.  Neurological: Negative for dizziness, numbness and headaches.  Hematological: Negative for adenopathy. Does not bruise/bleed easily.  Psychiatric/Behavioral: Negative for behavioral problems, confusion and sleep disturbance. The patient is not nervous/anxious and is not hyperactive.     No past medical history on file.   Social History   Socioeconomic History  . Marital status: Married    Spouse name: Not on file  . Number of children: Not on file  . Years of education: Not on file  . Highest education level: Not on file  Occupational History  . Not on file  Tobacco Use  . Smoking status: Not on file  Substance and Sexual Activity  . Alcohol use: Not on file  . Drug use: Not on file  . Sexual activity: Not on file  Other Topics Concern  . Not on file  Social History Narrative  . Not on file   Social Determinants of Health   Financial Resource Strain:   . Difficulty of Paying Living Expenses: Not on file  Food Insecurity:   . Worried About Programme researcher, broadcasting/film/video in the Last Year: Not on file  .  Ran Out of Food in the Last Year: Not on file  Transportation Needs:   . Lack of Transportation (Medical): Not on file  . Lack of Transportation (Non-Medical): Not on file  Physical Activity:   . Days of Exercise per Week: Not on file  . Minutes of Exercise per Session: Not on file  Stress:   . Feeling of Stress : Not on file  Social Connections:   . Frequency of Communication with Friends and Family: Not on file  . Frequency of Social Gatherings with Friends and Family: Not on file  . Attends Religious Services: Not on file  . Active Member of Clubs or Organizations: Not on file  . Attends Banker Meetings: Not on file  . Marital Status: Not on file  Intimate Partner Violence:   . Fear of Current or Ex-Partner: Not on file  . Emotionally Abused: Not on file  . Physically Abused: Not on file  . Sexually Abused: Not on file      No family history on file.  Not on File  Current Outpatient Medications on File Prior to Visit  Medication Sig Dispense Refill  . aspirin 81 MG chewable tablet Chew 81 mg by mouth daily.    Marland Kitchen gabapentin (NEURONTIN) 300 MG capsule Take 1 capsule (300 mg total) by mouth 3 (three) times daily. 90 capsule 3  . insulin NPH Human (NOVOLIN N) 100 UNIT/ML injection Inject 30 units every morning before breakfast, and 20 units  every evening before dinner.    . Insulin Syringe-Needle U-100 (INSULIN SYRINGE .5CC/30GX5/16") 30G X 5/16" 0.5 ML MISC For use with Reli-On brand NPH insulin.  One injection twice daily.    Marland Kitchen losartan (COZAAR) 50 MG tablet TAKE 1 TABLET(50 MG) BY MOUTH DAILY    . metoprolol succinate (TOPROL-XL) 100 MG 24 hr tablet TAKE 1 TABLET(100 MG) BY MOUTH DAILY    . NIFEdipine (ADALAT CC) 90 MG 24 hr tablet Take 90 mg by mouth daily.    . Omega-3 Fatty Acids (FISH OIL) 1000 MG CAPS Take 4 capsules by mouth daily.    . simvastatin (ZOCOR) 40 MG tablet TAKE 1 TABLET(40 MG) BY MOUTH AT BEDTIME    . tamsulosin (FLOMAX) 0.4 MG CAPS capsule  Take 1 capsule (0.4 mg total) by mouth daily. 30 capsule 3  . TRUE METRIX BLOOD GLUCOSE TEST test strip SMARTSIG:Via Meter 1 to 3 Times Daily     No current facility-administered medications on file prior to visit.    BP 115/79   Pulse 76   Resp 18   Ht 6' (1.829 m)   Wt 227 lb (103 kg)   SpO2 97%   BMI 30.79 kg/m       Objective:   Physical Exam  General Mental Status- Alert. General Appearance- Not in acute distress.   Skin General: Color- Normal Color. Moisture- Normal Moisture.  Neck Carotid Arteries- Normal color. Moisture- Normal Moisture. No carotid bruits. No JVD.  Chest and Lung Exam Auscultation: Breath Sounds:-Normal.  Cardiovascular Auscultation:Rythm- Regular. Murmurs & Other Heart Sounds:Auscultation of the heart reveals- No Murmurs.  Abdomen Inspection:-Inspeection Normal. Palpation/Percussion:Note:No mass. Palpation and Percussion of the abdomen reveal- Non Tender, Non Distended + BS, no rebound or guarding.   Neurologic Cranial Nerve exam:- CN III-XII intact(No nystagmus), symmetric smile. Strength:- 5/5 equal and symmetric strength both upper and lower extremities.      Assessment & Plan:  Your blood pressure is well controlled today. Continue metoprolol and losartan.  For high cholesterol continue zocor.   For diabetes continue novolin 30 units in am and 20 units pm. Last a1c 7.1. followed by endocrinologist.  Counseled on screening colonoscopy. Can refer when ready.   Will get cmp and lipid panel today.  For bph improved with flomax but some mild persisting symptoms increase to 0.8 mg daily.  Follow up in 6 months or as needed.  Esperanza Richters, PA-C

## 2020-06-23 NOTE — Patient Instructions (Addendum)
Your blood pressure is well controlled today. Continue metoprolol and losartan.  For high cholesterol continue zocor.   For diabetes continue novolin 30 units in am and 20 units pm. Last a1c 7.1. followed by endocrinologist.  Counseled on screening colonoscopy. Can refer when ready.   Will get cmp and lipid panel today.  For bph improved with flomax but some mild persisting symptoms increase to 0.8 mg daily.  Follow up in 6 months or as needed.

## 2020-07-19 ENCOUNTER — Encounter: Payer: Self-pay | Admitting: Medical

## 2020-09-27 ENCOUNTER — Other Ambulatory Visit: Payer: Self-pay | Admitting: Medical

## 2020-10-29 ENCOUNTER — Other Ambulatory Visit: Payer: Self-pay | Admitting: Medical

## 2020-11-28 ENCOUNTER — Other Ambulatory Visit: Payer: Self-pay | Admitting: Medical

## 2020-12-21 ENCOUNTER — Other Ambulatory Visit: Payer: Self-pay

## 2020-12-21 ENCOUNTER — Ambulatory Visit (INDEPENDENT_AMBULATORY_CARE_PROVIDER_SITE_OTHER): Payer: 59 | Admitting: Medical

## 2020-12-21 ENCOUNTER — Encounter: Payer: Self-pay | Admitting: Medical

## 2020-12-21 VITALS — BP 110/62 | HR 85 | Temp 98.2°F | Ht 70.0 in | Wt 221.8 lb

## 2020-12-21 DIAGNOSIS — E1022 Type 1 diabetes mellitus with diabetic chronic kidney disease: Secondary | ICD-10-CM | POA: Diagnosis not present

## 2020-12-21 DIAGNOSIS — F17200 Nicotine dependence, unspecified, uncomplicated: Secondary | ICD-10-CM | POA: Diagnosis not present

## 2020-12-21 DIAGNOSIS — I10 Essential (primary) hypertension: Secondary | ICD-10-CM | POA: Diagnosis not present

## 2020-12-21 DIAGNOSIS — E785 Hyperlipidemia, unspecified: Secondary | ICD-10-CM

## 2020-12-21 LAB — COMPREHENSIVE METABOLIC PANEL
ALT: 22 U/L (ref 0–53)
AST: 18 U/L (ref 0–37)
Albumin: 3.9 g/dL (ref 3.5–5.2)
Alkaline Phosphatase: 57 U/L (ref 39–117)
BUN: 35 mg/dL — ABNORMAL HIGH (ref 6–23)
CO2: 27 mEq/L (ref 19–32)
Calcium: 9.1 mg/dL (ref 8.4–10.5)
Chloride: 107 mEq/L (ref 96–112)
Creatinine, Ser: 1.91 mg/dL — ABNORMAL HIGH (ref 0.40–1.50)
GFR: 36.53 mL/min — ABNORMAL LOW (ref >60.00)
Glucose, Bld: 118 mg/dL — ABNORMAL HIGH (ref 70–99)
Potassium: 4.7 mEq/L (ref 3.5–5.1)
Sodium: 141 mEq/L (ref 135–145)
Total Bilirubin: 0.5 mg/dL (ref 0.2–1.2)
Total Protein: 6.4 g/dL (ref 6.0–8.3)

## 2020-12-21 LAB — LIPID PANEL
Cholesterol: 123 mg/dL (ref 0–200)
HDL: 28 mg/dL — ABNORMAL LOW (ref >39.00)
LDL Cholesterol: 75 mg/dL (ref 0–99)
NonHDL: 95.07
Total CHOL/HDL Ratio: 4
Triglycerides: 102 mg/dL (ref 0.0–149.0)
VLDL: 20.4 mg/dL (ref 0.0–40.0)

## 2020-12-21 MED ORDER — BUPROPION HCL ER (XL) 150 MG PO TB24: 150.0000 mg | ORAL_TABLET | Freq: Every day | ORAL | 3 refills | Status: AC

## 2020-12-21 NOTE — Patient Instructions (Addendum)
History of htn. Bp well controlled. Continue current losartan, metprolol and nifedipine.  For high cholesterol continue zocor and will get cmp and lipid panel today.  For diabetes a1c 6.9 recently. Good job. Continue follow up and treatment with endocrinologist.  For smoking would offer wellbutrin if you decide want to try. Ct chest for screening declined until gets medicare. I sent wellbutrin in to pharmacy but hold off taking due to potential med interaction with beta blocker. First check bp and pulse daily for one week. Notify me of those readings. If you want to start wellbutrin will need to dose adjust down on metoprolol.   Follow up 4-6 weeks or as needed

## 2020-12-21 NOTE — Progress Notes (Signed)
Subjective:    Patient ID: [name], male    DOB: 04-26-1956, 65 y.o.   MRN: [id number]  HPI  Pt in for follow up.  Hx of htn. Bp well controlled today. Pt is on losartan, metoprolol and nifedipine.  Pt on zocor for hyperlipidemia.  Pt saw endocrinologist about 2 weeks ago. A1c was 6.9.   Pt labs end of April showed gfr 40 and cr 1.87.  Pt declines GI referral. He will wait till get medicare due to cost.  He also states will decline ct chest until gets medicare.  Smoker. Recenty but back to 1/2 pack a day.  Review of Systems  Constitutional: Negative for chills, fatigue and fever.  HENT: Negative for congestion and dental problem.   Respiratory: Negative for cough, chest tightness, shortness of breath and wheezing.   Cardiovascular: Negative for chest pain and palpitations.  Gastrointestinal: Negative for abdominal pain, blood in stool, diarrhea, nausea and vomiting.  Genitourinary: Negative for dysuria, flank pain, frequency, genital sores and hematuria.  Musculoskeletal: Negative for back pain.  Skin: Negative for rash.  Neurological: Negative for dizziness, seizures, speech difficulty, weakness, light-headedness and headaches.  Hematological: Negative for adenopathy. Does not bruise/bleed easily.  Psychiatric/Behavioral: Negative for behavioral problems and confusion.   No past medical history on file.   Social History   Socioeconomic History  . Marital status: Married    Spouse name: Not on file  . Number of children: Not on file  . Years of education: Not on file  . Highest education level: Not on file  Occupational History  . Not on file  Tobacco Use  . Smoking status: Current Every Day Smoker    Packs/day: 0.50    Types: Cigarettes  . Smokeless tobacco: Never Used  Substance and Sexual Activity  . Alcohol use: Not on file  . Drug use: Not on file  . Sexual activity: Not on file  Other Topics Concern  . Not on file  Social History Narrative  . Not  on file   Social Determinants of Health   Financial Resource Strain: Not on file  Food Insecurity: Not on file  Transportation Needs: Not on file  Physical Activity: Not on file  Stress: Not on file  Social Connections: Not on file  Intimate Partner Violence: Not on file     No family history on file.  No Known Allergies  Current Outpatient Medications on File Prior to Visit  Medication Sig Dispense Refill  . aspirin 81 MG chewable tablet Chew 81 mg by mouth daily.    Marland Kitchen gabapentin (NEURONTIN) 300 MG capsule Take 1 capsule (300 mg total) by mouth 3 (three) times daily. 90 capsule 5  . insulin NPH Human (NOVOLIN N) 100 UNIT/ML injection Inject 30 units every morning before breakfast, and 20 units every evening before dinner.    . Insulin Syringe-Needle U-100 (INSULIN SYRINGE .5CC/30GX5/16") 30G X 5/16" 0.5 ML MISC For use with Reli-On brand NPH insulin.  One injection twice daily.    Marland Kitchen losartan (COZAAR) 50 MG tablet TAKE 1 TABLET(50 MG) BY MOUTH DAILY    . metoprolol succinate (TOPROL-XL) 100 MG 24 hr tablet TAKE 1 TABLET(100 MG) BY MOUTH DAILY    . Omega-3 Fatty Acids (FISH OIL) 1000 MG CAPS Take 4 capsules by mouth daily.    . simvastatin (ZOCOR) 40 MG tablet TAKE 1 TABLET(40 MG) BY MOUTH AT BEDTIME    . tamsulosin (FLOMAX) 0.4 MG CAPS capsule Take 1 capsule (0.4 mg  total) by mouth daily. 30 capsule 3  . tamsulosin (FLOMAX) 0.4 MG CAPS capsule Take 2 capsules by mouth once daily 60 capsule 0  . TRUE METRIX BLOOD GLUCOSE TEST test strip SMARTSIG:Via Meter 1 to 3 Times Daily    . NIFEdipine (ADALAT CC) 90 MG 24 hr tablet Take 90 mg by mouth daily.     No current facility-administered medications on file prior to visit.    BP 110/62   Pulse 85   Temp 98.2 F (36.8 C) (Oral)   Ht 5\' 10"  (1.778 m)   Wt 221 lb 12.8 oz (100.6 kg)   SpO2 99%   BMI 31.82 kg/m        Objective:   Physical Exam  General Mental Status- Alert. General Appearance- Not in acute distress.    Skin General: Color- Normal Color. Moisture- Normal Moisture.  Neck Carotid Arteries- Normal color. Moisture- Normal Moisture. No carotid bruits. No JVD.  Chest and Lung Exam Auscultation: Breath Sounds:-Normal.  Cardiovascular Auscultation:Rythm- Regular. Murmurs & Other Heart Sounds:Auscultation of the heart reveals- No Murmurs.  Abdomen Inspection:-Inspeection Normal. Palpation/Percussion:Note:No mass. Palpation and Percussion of the abdomen reveal- Non Tender, Non Distended + BS, no rebound or guarding.    Neurologic Cranial Nerve exam:- CN III-XII intact(No nystagmus), symmetric smile. Drift Test:- No drift. Romberg Exam:- Negative.  Heal to Toe Gait exam:-Normal. Finger to Nose:- Normal/Intact Strength:- 5/5 equal and symmetric strength both upper and lower extremities.      Assessment & Plan:  History of htn. Bp well controlled. Continue current losartan, metprolol and nifedipine.  For high cholestero continue zocor and will get cmp and lipid panel today.  For diabetes a1c 6.9 recently. Good job. Continue follow up and treatment with endocrinologist.  For smoking would offer wellbutrin if you decide want to try. Ct chest for screening declined until gets medicare. I sent wellbutin in to pharmacy but hold off taking due to potential med interaction with beta blocker. First check bp and pulse daily for one week. Notify me of those readings. If you want to start wellbutrin will need to dose adjust down on metoprolol.   Follow up 4-6 weeks or as needed

## 2020-12-23 ENCOUNTER — Other Ambulatory Visit: Payer: Self-pay | Admitting: Medical

## 2020-12-26 ENCOUNTER — Telehealth: Payer: Self-pay | Admitting: Medical

## 2020-12-26 DIAGNOSIS — N401 Enlarged prostate with lower urinary tract symptoms: Secondary | ICD-10-CM

## 2020-12-26 NOTE — Telephone Encounter (Signed)
Pt states you told him to take 3 during last visit so he has been taking 3 , and urology didn't tell him to take 3 . And patient states he only has one pill left .

## 2020-12-26 NOTE — Telephone Encounter (Signed)
Called pt and lvm to return call.  

## 2020-12-26 NOTE — Telephone Encounter (Signed)
Opened to review chart on flomax dosing.

## 2020-12-26 NOTE — Telephone Encounter (Signed)
Opened to review 

## 2020-12-26 NOTE — Telephone Encounter (Signed)
Script states BID

## 2020-12-26 NOTE — Telephone Encounter (Signed)
I think there is some misunderstanding.Not sure how but max dose of flomax is 0.8 mg. If he feels that his symptoms are not adequatley controlled with this dose then I could offer referral to urologist.

## 2020-12-26 NOTE — Telephone Encounter (Signed)
Back in early winter my note states advised to increase flomax to 0.8 mg which would be 2 tab. I usually/never recommend 3 tabs.  So not sure about that?  Did he see urologist and get that recommendation to take 3?   Last week visit note no comments about flomax?  In the past he was on tab daily and had advised to increase to 2.

## 2020-12-26 NOTE — Telephone Encounter (Signed)
Pt, stated that his suppose to be taking 3 capsule   Medication: tamsulosin (FLOMAX) 0.4 MG CAPS capsule [[id number]     Has the patient contacted their pharmacy? no (If no, request that the patient contact the pharmacy for the refill.) (If yes, when and what did the pharmacy advise?)    Preferred Pharmacy (with phone number or street name): [id number] [address] [place], [place] - [address] Phone:  [phone]  Fax:  [phone]         Agent: Please be advised that RX refills may take up to 3 business days. We ask that you follow-up with your pharmacy.

## 2020-12-26 NOTE — Telephone Encounter (Signed)
Referral to urologist placed. 

## 2020-12-26 NOTE — Telephone Encounter (Signed)
Pt would like referral to urology because he wakes up a few times a night and pt notified of directions

## 2020-12-26 NOTE — Telephone Encounter (Signed)
Can offer video or phone visit if he has questoins on flomax dosage.

## 2021-01-18 ENCOUNTER — Ambulatory Visit: Payer: 59 | Admitting: Medical

## 2021-01-19 ENCOUNTER — Other Ambulatory Visit: Payer: Self-pay

## 2021-01-19 ENCOUNTER — Ambulatory Visit (INDEPENDENT_AMBULATORY_CARE_PROVIDER_SITE_OTHER): Payer: 59 | Admitting: Medical

## 2021-01-19 VITALS — BP 113/66 | HR 77 | Temp 98.3°F | Resp 18 | Ht 70.0 in | Wt 224.0 lb

## 2021-01-19 DIAGNOSIS — R42 Dizziness and giddiness: Secondary | ICD-10-CM | POA: Diagnosis not present

## 2021-01-19 DIAGNOSIS — N401 Enlarged prostate with lower urinary tract symptoms: Secondary | ICD-10-CM

## 2021-01-19 DIAGNOSIS — F17200 Nicotine dependence, unspecified, uncomplicated: Secondary | ICD-10-CM

## 2021-01-19 DIAGNOSIS — E785 Hyperlipidemia, unspecified: Secondary | ICD-10-CM | POA: Diagnosis not present

## 2021-01-19 DIAGNOSIS — I10 Essential (primary) hypertension: Secondary | ICD-10-CM

## 2021-01-19 DIAGNOSIS — E1022 Type 1 diabetes mellitus with diabetic chronic kidney disease: Secondary | ICD-10-CM

## 2021-01-19 LAB — CBC WITH DIFFERENTIAL/PLATELET
Absolute Monocytes: 657 cells/uL (ref 200–950)
Basophils Absolute: 81 cells/uL (ref 0–200)
Basophils Relative: 0.8 %
Eosinophils Absolute: 253 cells/uL (ref 15–500)
Eosinophils Relative: 2.5 %
HCT: 44 % (ref 38.5–50.0)
Hemoglobin: 14.6 g/dL (ref 13.2–17.1)
Lymphs Abs: 2879 cells/uL (ref 850–3900)
MCH: 29.7 pg (ref 27.0–33.0)
MCHC: 33.2 g/dL (ref 32.0–36.0)
MCV: 89.6 fL (ref 80.0–100.0)
MPV: 13.2 fL — ABNORMAL HIGH (ref 7.5–12.5)
Monocytes Relative: 6.5 %
Neutro Abs: 6232 cells/uL (ref 1500–7800)
Neutrophils Relative %: 61.7 %
Platelets: 193 10*3/uL (ref 140–400)
RBC: 4.91 10*6/uL (ref 4.20–5.80)
RDW: 13.6 % (ref 11.0–15.0)
Total Lymphocyte: 28.5 %
WBC: 10.1 10*3/uL (ref 3.8–10.8)

## 2021-01-19 NOTE — Patient Instructions (Signed)
Blood pressure well controlled. Continue current 3 drug regimen.  Diabetes recently controlled.  For high cholesterol continue simvastatin.   For frequent urination/bph continue flomax and attend urologist appointment.  For rare dizziness check cbc. Check bp and make sure systolic not less than 110. If any dizziness with motor or sensory deficts then ED evaluation. Offered meclizine but declined.  For smoking cessation start wellbutrin.  Follow up in one month or as needed.

## 2021-01-19 NOTE — Progress Notes (Signed)
   Subjective:    Patient ID: [name], male    DOB: 1956/07/29, 65 y.o.   MRN: [id number]  HPI  Pt in for bp check. His bp today is 113/66.  Pt has new bp cuff and his bp at home has been 120/75.  Pt is on losartan 50 mg daily and toprol 100 mg xl daily and nifedipine 90 mg daily.  Diabetes hx. Sugars have not been over 150.  Hx of high cholesterol and on simvastatin  Pt is smoker and wanted to make sure his blood pressure was contolled before he started welbutrin.  Some occasional very transient light headed sensation.  Poor quality of sleep. Minimal due to frequent urination. On flomax .08 daily.  Review of Systems  Constitutional:  Negative for chills, fatigue and fever.  HENT:  Negative for congestion.   Respiratory:  Negative for cough, chest tightness, shortness of breath and wheezing.   Cardiovascular:  Negative for chest pain and palpitations.  Gastrointestinal:  Negative for abdominal pain.  Genitourinary:  Positive for frequency.  Musculoskeletal:  Negative for back pain.  Skin:  Negative for rash.  Neurological:  Negative for dizziness, tremors, numbness and headaches.       See hpi rare occasional seconds dizziness.  Hematological:  Negative for adenopathy. Does not bruise/bleed easily.  Psychiatric/Behavioral:  Negative for behavioral problems, confusion and hallucinations.       Objective:   Physical Exam  General Mental Status- Alert. General Appearance- Not in acute distress.   Skin General: Color- Normal Color. Moisture- Normal Moisture.  Neck Carotid Arteries- Normal color. Moisture- Normal Moisture. No carotid bruits. No JVD.  Chest and Lung Exam Auscultation: Breath Sounds:-Normal.  Cardiovascular Auscultation:Rythm- Regular. Murmurs & Other Heart Sounds:Auscultation of the heart reveals- No Murmurs.  Abdomen Inspection:-Inspeection Normal. Palpation/Percussion:Note:No mass. Palpation and Percussion of the abdomen reveal- Non Tender,  Non Distended + BS, no rebound or guarding.    Neurologic Cranial Nerve exam:- CN III-XII intact(No nystagmus), symmetric smile. Strength:- 5/5 equal and symmetric strength both upper and lower extremities.       Assessment & Plan:   Blood pressure well controlled. Continue current 3 drug regimen.  Diabetes recently controlled.  For high cholesterol continue simvastatin.   For frequent urination/bph continue flomax and attend urologist appointment.  For rare dizziness check cbc. Check bp and make sure systolic not less than 110. If any dizziness with motor or sensory deficts then ED evaluation. Offered meclizine but declined.  For smoking cessation start wellbutrin.  Follow up in one month or as needed.

## 2021-01-24 ENCOUNTER — Other Ambulatory Visit: Payer: Self-pay | Admitting: Medical

## 2022-02-28 IMAGING — DX DG LUMBAR SPINE 2-3V
3 series · 3 of 3 positions shown · non-contrast
Comparison: None.

CLINICAL DATA: Chronic low back and bilateral lower extremity pain.

EXAM:
LUMBAR SPINE - 2-3 VIEW

[l-spine ap]
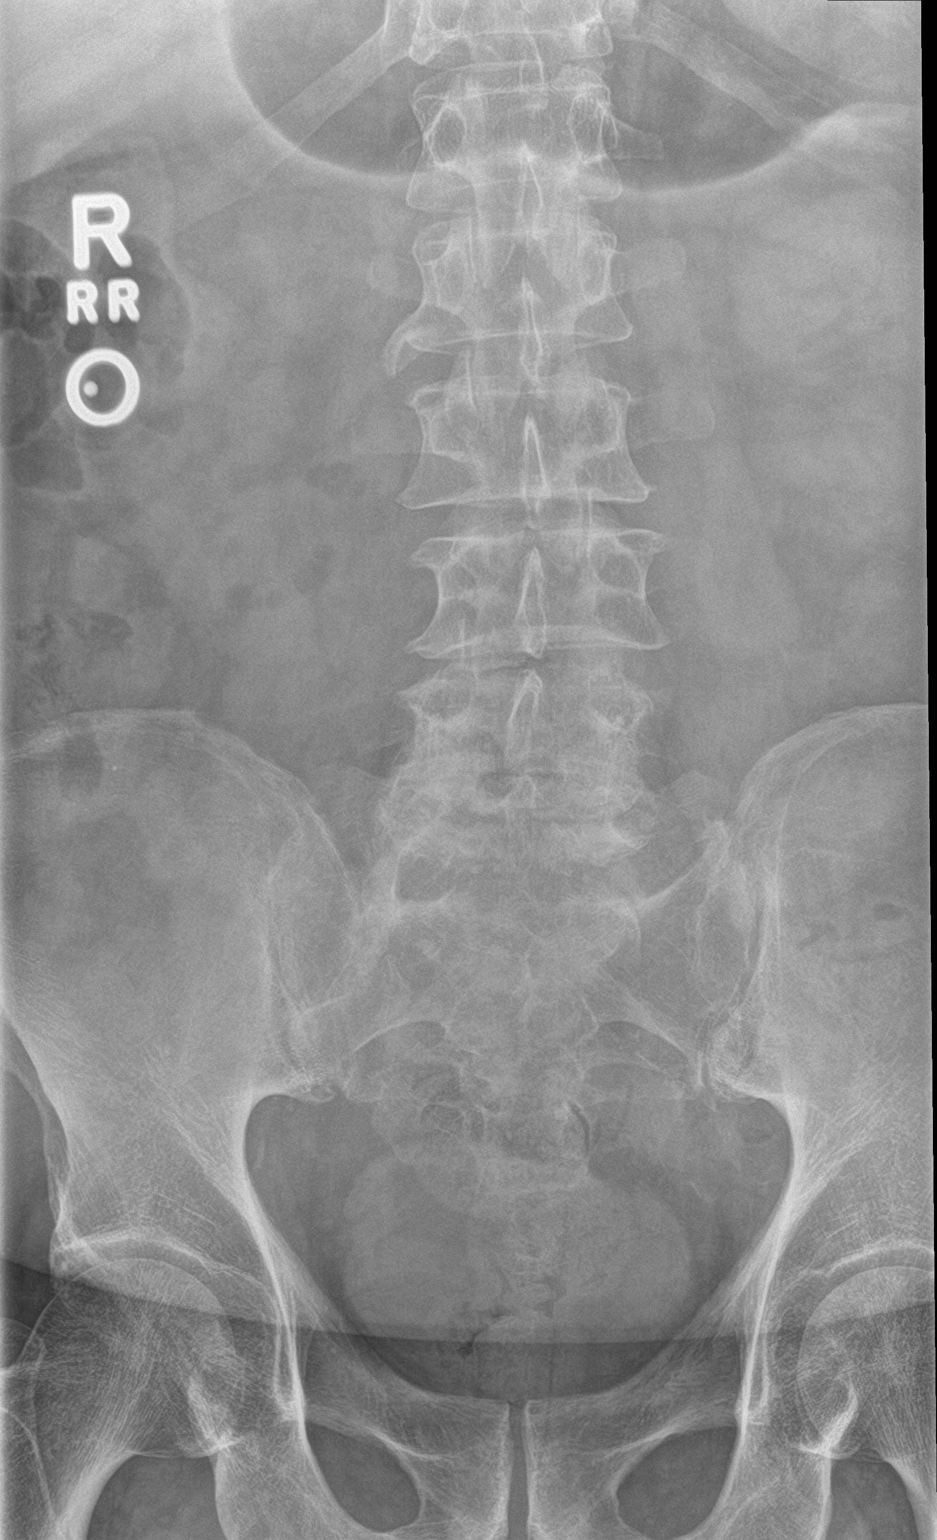

[l-spine lat]
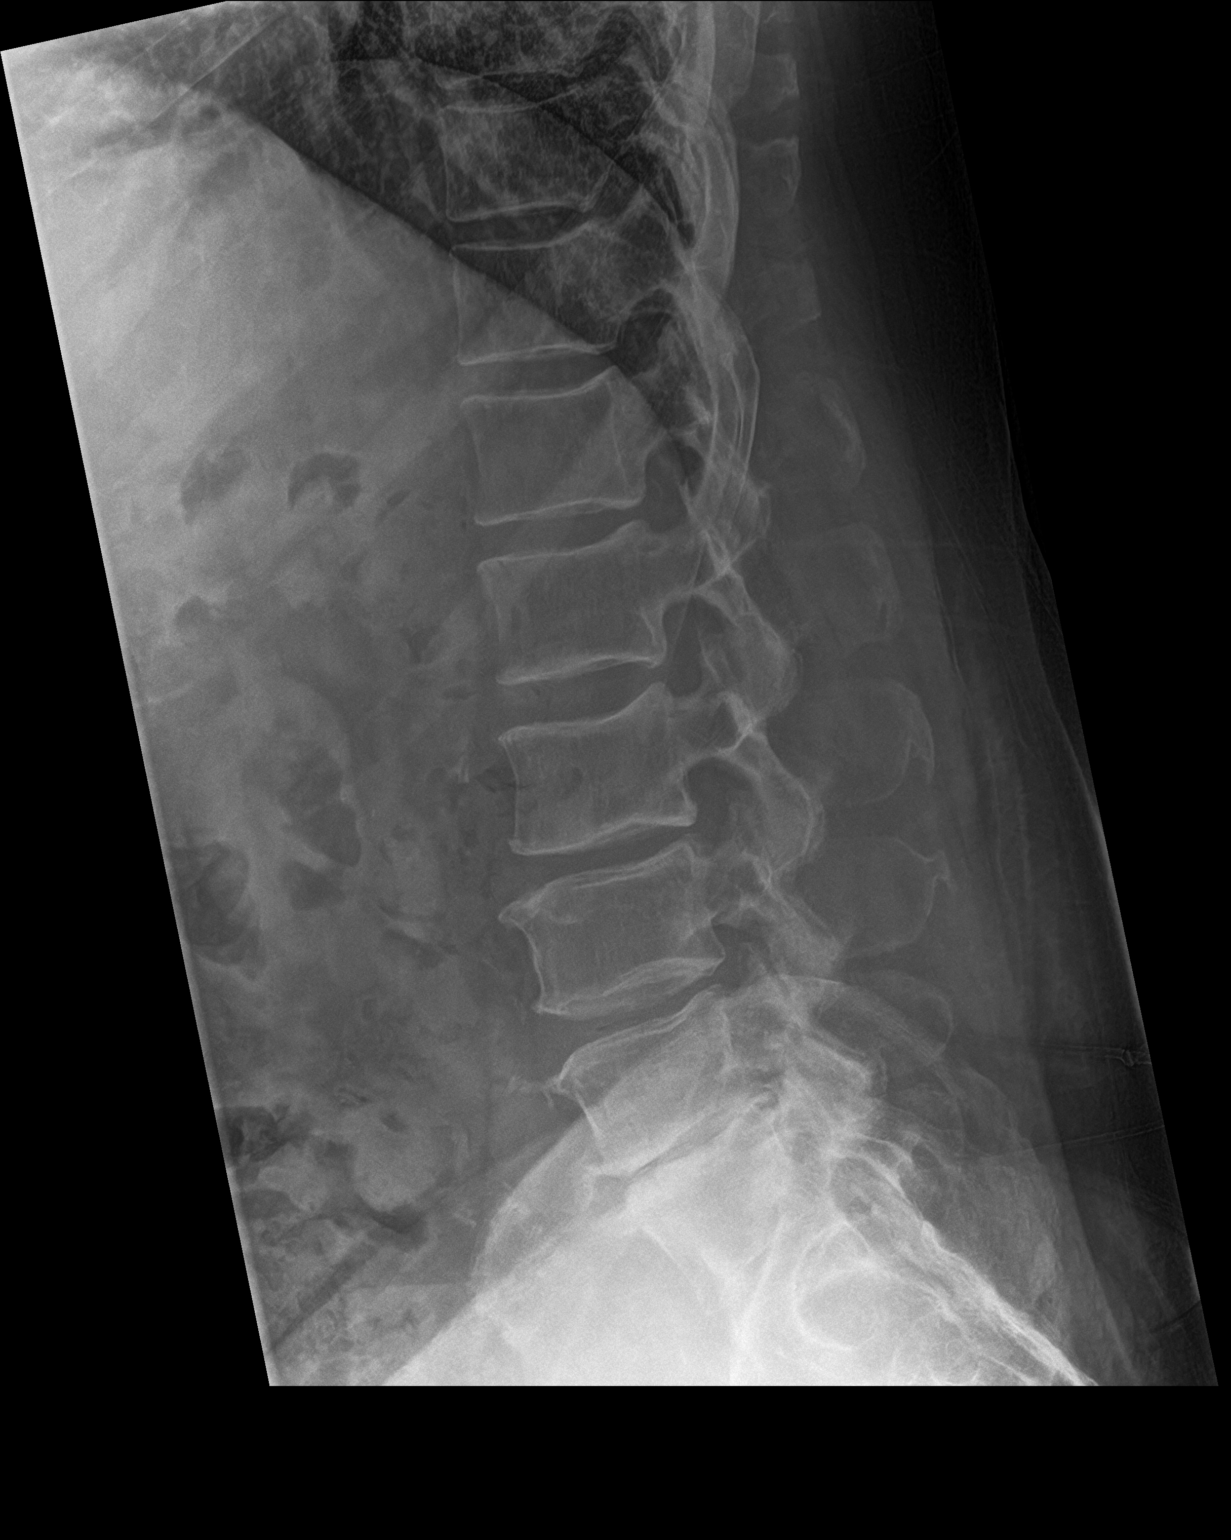

[l-spine spot]
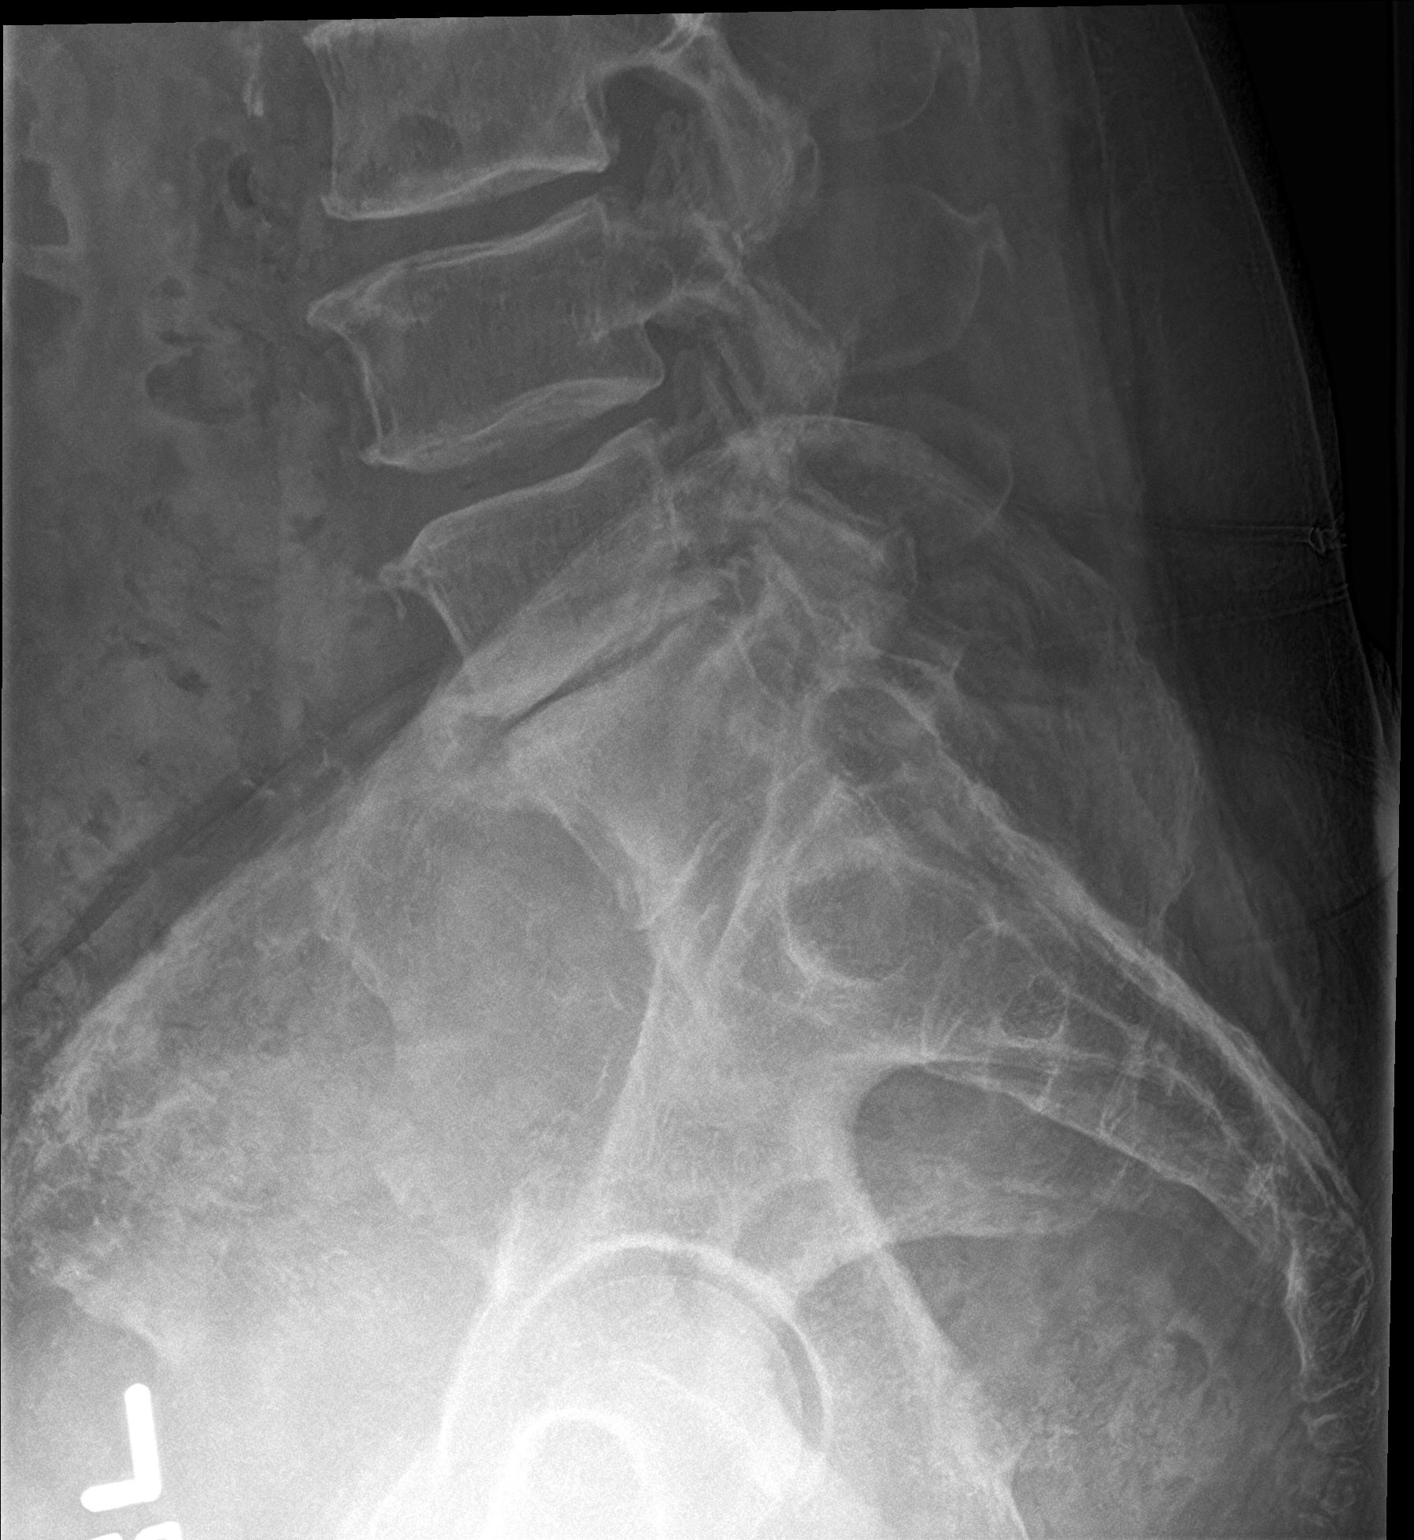

[3 of 3 positions shown; findings below may reference images not displayed]

FINDINGS: No fracture is noted. Minimal grade 1 retrolisthesis is noted at
L4-5 secondary to mild degenerative disc disease at this level. Mild
degenerative disc disease is also noted at L3-4. Severe degenerative
disc disease is noted at L5-S1.
IMPRESSION: Multilevel degenerative disc disease. No acute abnormality seen in
the lumbar spine.
